# Patient Record
Sex: Male | Born: 1970 | Race: Black or African American | Hispanic: No | Marital: Married | Smoking: Never smoker
Health system: Southern US, Community
[De-identification: ages and names within clinical notes are randomized; demographics above are authoritative.]

## PROBLEM LIST (undated history)

## (undated) DIAGNOSIS — Z87442 Personal history of urinary calculi: Secondary | ICD-10-CM

## (undated) DIAGNOSIS — H43821 Vitreomacular adhesion, right eye: Secondary | ICD-10-CM

## (undated) DIAGNOSIS — E785 Hyperlipidemia, unspecified: Secondary | ICD-10-CM

## (undated) DIAGNOSIS — Z973 Presence of spectacles and contact lenses: Secondary | ICD-10-CM

## (undated) DIAGNOSIS — J45909 Unspecified asthma, uncomplicated: Secondary | ICD-10-CM

## (undated) DIAGNOSIS — B54 Unspecified malaria: Secondary | ICD-10-CM

## (undated) DIAGNOSIS — B1910 Unspecified viral hepatitis B without hepatic coma: Secondary | ICD-10-CM

## (undated) DIAGNOSIS — Z8619 Personal history of other infectious and parasitic diseases: Secondary | ICD-10-CM

## (undated) DIAGNOSIS — I10 Essential (primary) hypertension: Secondary | ICD-10-CM

## (undated) DIAGNOSIS — N329 Bladder disorder, unspecified: Secondary | ICD-10-CM

## (undated) DIAGNOSIS — R3915 Urgency of urination: Secondary | ICD-10-CM

## (undated) DIAGNOSIS — R300 Dysuria: Secondary | ICD-10-CM

## (undated) DIAGNOSIS — R351 Nocturia: Secondary | ICD-10-CM

## (undated) DIAGNOSIS — N289 Disorder of kidney and ureter, unspecified: Secondary | ICD-10-CM

## (undated) DIAGNOSIS — C801 Malignant (primary) neoplasm, unspecified: Secondary | ICD-10-CM

## (undated) HISTORY — DX: Essential (primary) hypertension: I10

## (undated) HISTORY — DX: Unspecified asthma, uncomplicated: J45.909

---

## 2000-09-28 ENCOUNTER — Ambulatory Visit (HOSPITAL_COMMUNITY): Admission: RE | Admit: 2000-09-28 | Discharge: 2000-09-28 | Payer: Self-pay | Admitting: Family Medicine

## 2000-09-28 ENCOUNTER — Encounter: Payer: Self-pay | Admitting: Family Medicine

## 2004-03-18 ENCOUNTER — Encounter: Admission: RE | Admit: 2004-03-18 | Discharge: 2004-03-18 | Payer: Self-pay | Admitting: Internal Medicine

## 2005-12-27 ENCOUNTER — Emergency Department (HOSPITAL_COMMUNITY): Admission: EM | Admit: 2005-12-27 | Discharge: 2005-12-27 | Payer: Self-pay | Admitting: Emergency Medicine

## 2010-04-19 ENCOUNTER — Emergency Department (HOSPITAL_COMMUNITY): Admission: EM | Admit: 2010-04-19 | Discharge: 2010-04-19 | Payer: Self-pay | Admitting: Emergency Medicine

## 2010-10-27 LAB — POCT I-STAT, CHEM 8
BUN: 9 mg/dL (ref 6–23)
Chloride: 102 mEq/L (ref 96–112)
Creatinine, Ser: 1.1 mg/dL (ref 0.4–1.5)
Glucose, Bld: 85 mg/dL (ref 70–99)
HCT: 58 % — ABNORMAL HIGH (ref 39.0–52.0)
Potassium: 3.2 mEq/L — ABNORMAL LOW (ref 3.5–5.1)

## 2010-10-27 LAB — URINALYSIS, ROUTINE W REFLEX MICROSCOPIC
Bilirubin Urine: NEGATIVE
Glucose, UA: NEGATIVE mg/dL
Ketones, ur: NEGATIVE mg/dL
Leukocytes, UA: NEGATIVE
Nitrite: NEGATIVE
Protein, ur: NEGATIVE mg/dL
Specific Gravity, Urine: 1.014 (ref 1.005–1.030)
Urobilinogen, UA: 1 mg/dL (ref 0.0–1.0)
pH: 5.5 (ref 5.0–8.0)

## 2010-10-27 LAB — URINE MICROSCOPIC-ADD ON

## 2010-10-27 LAB — URINE CULTURE

## 2013-10-10 ENCOUNTER — Ambulatory Visit (INDEPENDENT_AMBULATORY_CARE_PROVIDER_SITE_OTHER): Payer: BC Managed Care – PPO | Admitting: Internal Medicine

## 2013-10-10 VITALS — BP 128/78 | HR 68 | Temp 98.0°F | Resp 17 | Ht 71.0 in | Wt 209.0 lb

## 2013-10-10 DIAGNOSIS — B80 Enterobiasis: Secondary | ICD-10-CM

## 2013-10-10 DIAGNOSIS — L290 Pruritus ani: Secondary | ICD-10-CM

## 2013-10-10 MED ORDER — ALBENDAZOLE 200 MG PO TABS: ORAL_TABLET | ORAL | Status: DC

## 2013-10-10 NOTE — Patient Instructions (Signed)
Anal Pruritus Anal pruritus is an itching of the anus, which is often due to increased moisture of the skin around the anus. Moisture may be due to sweating or a small amount of remaining stool. The itching and scratching can cause further skin damage.  CAUSES   Poor hygiene.  Excessive moisture from sweating or residual stool in the anal area.  Perfumed soaps and sprays and colored toilet paper.  Chemicals in the foods you eat.  Dietary factors such as caffeine, beer, milk products, chocolate, nuts, citrus fruits, tomatoes, spicy seasonings, jalapeno peppers, and salsa.  Hemorrhoids, infections, and other anal diseases.  Excessive washing.  Overuse of laxatives.  Skin disorders (psoriasis, eczema, or seborrhea). HOME CARE INSTRUCTIONS   Practice good hygiene.  Clean the anal area gently with wet toilet paper, baby wipes, or a wet washcloth after every bowel movement and at bedtime. Avoid using soaps on the anal area. Dry the area thoroughly. Pat the area dry with toilet paper or a towel.  Do not scrub the anal area with anything, even toilet paper.  Try not to scratch the itchy area. Scratching produces more damage, which makes the itching worse.  Take sitz baths in warm water for 15 to 20 minutes, 2 to 3 times a day. Pat the area dry with a soft cloth after each bath.  Zinc oxide ointment or a moisture barrier cream can be applied several times daily to protect the skin.  Only take medicines as directed by your caregiver.  Talk to your caregiver about fiber supplements. These are helpful in normalizing the stool if you have frequent loose stools.  Wear cotton underwear and loose clothing.  Do not use irritants such as bubble baths, scented toilet paper, or genital deodorants. SEEK MEDICAL CARE IF:   Itching does not improve in several days or gets worse.  You have a fever.  There are problems with increased pain, swelling, or redness. MAKE SURE YOU:   Understand  these instructions.  Will watch your condition.  Will get help right away if you are not doing well or get worse. Document Released: 01/30/2011 Document Revised: 10/23/2011 Document Reviewed: 01/30/2011 [facility] Patient Information 2014 [place]. Pinworms Your caregiver has diagnosed you as having pinworms. These are common infections of children and less common in adults. Pinworms are a small white worm less one quarter to a half inch in length. They look like a tiny piece of white thread. A person gets pinworms by swallowing the eggs of the worm. These eggs are obtained from contaminated (infected or tainted) food, clothing, toys, or any object that comes in contact with the body and mouth. The eggs hatch in the small bowel (intestine) and quickly develop into adult worms in the large bowel (colon). The male worm develops in the large intestine for about two to four weeks. It lays eggs around the anus during the night. These eggs then contaminate clothing, fingers, bedding, and anything else they come in contact with. The main symptoms (problems) of pinworms are itching around the anus (pruritus ani) at night. Children may also have occasional abdominal (belly) pain, loss of appetite, problems sleeping, and irritability. If you or your child has continual anal itching at night, that is a good sign to consult your caregiver. Just about everybody at some time in their life has acquired pinworms. Getting them has nothing to do with the cleanliness of your household or your personal hygiene. Complications are uncommon. DIAGNOSIS  Diagnosis can be made by  looking at your child's anus at night when the pinworms are laying eggs or by sticking a piece of scotch tape on the anus in the morning. The eggs will stick to the tape. This can be examined by your caregiver who can make a diagnosis by looking at the tape under a microscope. Sometimes several scotch tape swabs will be necessary.  HOME CARE  INSTRUCTIONS   Your caregiver will give you medications. They should be taken as directed. Eggs are easily passed. The whole family often needs treatment even if no symptoms are present. Several treatments may be necessary. A second treatment is usually needed after two weeks to a month.  Maintain strict hygiene. Washing hands often and keeping the nails short is helpful. Children often scratch themselves at night in their sleep so the eggs get under the nail. This causes reinfection by hand to mouth contamination.  Change bedding and clothing daily. These should be washed in hot water and dried. This kills the eggs and stops the life cycle of the worm.  Pets are not known to carry pinworms.  An ointment may be used at night for anal itching.  See your caregiver if problems continue. Document Released: 07/28/2000 Document Revised: 10/23/2011 Document Reviewed: 07/28/2008 [facility] Patient Information 2014 [place].

## 2013-10-10 NOTE — Progress Notes (Signed)
   Subjective:    Patient ID: James Larsen, male    DOB: [birth year], 43 y.o.   MRN: [id number]  HPI pt here for anal itching for the past x 2 months , pt thinks it may be parasites. Itching wakes him up at night. However not every night. Bowels moving okay, eating and drinking okay. Denies fever, nausea, vomiting, constipation, or diarrhea. PCP [facility]. History of HBP.  Has 3 children 5,2, and 29mos.  He thinks he has pinworms   Review of Systems     Objective:   Physical Exam  Constitutional: He appears well-developed and well-nourished.  HENT:  Head: Normocephalic.  Eyes: EOM are normal.  Pulmonary/Chest: Effort normal.  Genitourinary: Prostate normal, testes normal and penis normal. Rectal exam shows no external hemorrhoid, no fissure, no mass, no tenderness and anal tone normal.  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

## 2013-10-10 NOTE — Progress Notes (Signed)
   Subjective:    Patient ID: James Larsen, male    DOB: 1971/01/16, 43 y.o.   MRN: [id number]  HPI    Review of Systems     Objective:   Physical Exam        Assessment & Plan:

## 2014-04-25 ENCOUNTER — Observation Stay (HOSPITAL_COMMUNITY): Payer: BC Managed Care – PPO

## 2014-04-25 ENCOUNTER — Observation Stay (HOSPITAL_COMMUNITY)
Admission: EM | Admit: 2014-04-25 | Discharge: 2014-04-25 | Disposition: A | Discharge status: Home / Self Care | Payer: BC Managed Care – PPO | Attending: Internal Medicine | Admitting: Internal Medicine

## 2014-04-25 ENCOUNTER — Emergency Department (HOSPITAL_COMMUNITY): Payer: BC Managed Care – PPO

## 2014-04-25 ENCOUNTER — Encounter (HOSPITAL_COMMUNITY): Payer: Self-pay | Admitting: Emergency Medicine

## 2014-04-25 DIAGNOSIS — Z79899 Other long term (current) drug therapy: Secondary | ICD-10-CM | POA: Diagnosis not present

## 2014-04-25 DIAGNOSIS — J4520 Mild intermittent asthma, uncomplicated: Secondary | ICD-10-CM

## 2014-04-25 DIAGNOSIS — R079 Chest pain, unspecified: Secondary | ICD-10-CM | POA: Diagnosis present

## 2014-04-25 DIAGNOSIS — J45909 Unspecified asthma, uncomplicated: Secondary | ICD-10-CM | POA: Diagnosis not present

## 2014-04-25 DIAGNOSIS — I10 Essential (primary) hypertension: Secondary | ICD-10-CM | POA: Diagnosis not present

## 2014-04-25 LAB — BASIC METABOLIC PANEL
ANION GAP: 11 (ref 5–15)
BUN: 10 mg/dL (ref 6–23)
CALCIUM: 8.8 mg/dL (ref 8.4–10.5)
CHLORIDE: 104 meq/L (ref 96–112)
CO2: 25 meq/L (ref 19–32)
Creatinine, Ser: 0.98 mg/dL (ref 0.50–1.35)
GFR calc non Af Amer: >90 mL/min (ref >90)
Glucose, Bld: 91 mg/dL (ref 70–99)
Potassium: 3.7 mEq/L (ref 3.7–5.3)
SODIUM: 140 meq/L (ref 137–147)

## 2014-04-25 LAB — CBC
HCT: 42.7 % (ref 39.0–52.0)
Hemoglobin: 15.4 g/dL (ref 13.0–17.0)
MCH: 30.1 pg (ref 26.0–34.0)
MCHC: 36.1 g/dL — ABNORMAL HIGH (ref 30.0–36.0)
MCV: 83.4 fL (ref 78.0–100.0)
PLATELETS: 180 10*3/uL (ref 150–400)
RBC: 5.12 MIL/uL (ref 4.22–5.81)
RDW: 12.3 % (ref 11.5–15.5)
WBC: 2.7 10*3/uL — AB (ref 4.0–10.5)

## 2014-04-25 LAB — I-STAT TROPONIN, ED: TROPONIN I, POC: 0 ng/mL (ref 0.00–0.08)

## 2014-04-25 LAB — TROPONIN I

## 2014-04-25 MED ORDER — ASPIRIN EC 325 MG PO TBEC: 325.0000 mg | DELAYED_RELEASE_TABLET | Freq: Every day | ORAL | Status: DC

## 2014-04-25 MED ORDER — ACETAMINOPHEN 325 MG PO TABS: 650.0000 mg | ORAL_TABLET | Freq: Four times a day (QID) | ORAL | Status: DC | PRN

## 2014-04-25 MED ORDER — HYDROCHLOROTHIAZIDE 12.5 MG PO CAPS
12.5000 mg | ORAL_CAPSULE | Freq: Every day | ORAL | Status: DC
  Administered 2014-04-25: 12.5 mg via ORAL
  Filled 2014-04-25: qty 1

## 2014-04-25 MED ORDER — LOSARTAN POTASSIUM 50 MG PO TABS
50.0000 mg | ORAL_TABLET | Freq: Every day | ORAL | Status: DC
  Administered 2014-04-25: 50 mg via ORAL
  Filled 2014-04-25: qty 1

## 2014-04-25 MED ORDER — ALUM & MAG HYDROXIDE-SIMETH 200-200-20 MG/5ML PO SUSP: 30.0000 mL | Freq: Four times a day (QID) | ORAL | Status: DC | PRN

## 2014-04-25 MED ORDER — HYDROMORPHONE HCL PF 1 MG/ML IJ SOLN: 0.5000 mg | INTRAMUSCULAR | Status: DC | PRN

## 2014-04-25 MED ORDER — AMITRIPTYLINE HCL 25 MG PO TABS
25.0000 mg | ORAL_TABLET | Freq: Every day | ORAL | Status: DC
  Filled 2014-04-25: qty 1

## 2014-04-25 MED ORDER — ENOXAPARIN SODIUM 40 MG/0.4ML ~~LOC~~ SOLN
40.0000 mg | SUBCUTANEOUS | Status: DC
  Administered 2014-04-25: 40 mg via SUBCUTANEOUS
  Filled 2014-04-25: qty 0.4

## 2014-04-25 MED ORDER — NITROGLYCERIN 0.4 MG SL SUBL: 0.4000 mg | SUBLINGUAL_TABLET | SUBLINGUAL | Status: DC | PRN

## 2014-04-25 MED ORDER — SODIUM CHLORIDE 0.9 % IJ SOLN: 3.0000 mL | Freq: Two times a day (BID) | INTRAMUSCULAR | Status: DC

## 2014-04-25 MED ORDER — OXYCODONE HCL 5 MG PO TABS: 5.0000 mg | ORAL_TABLET | ORAL | Status: DC | PRN

## 2014-04-25 MED ORDER — ACETAMINOPHEN 650 MG RE SUPP: 650.0000 mg | Freq: Four times a day (QID) | RECTAL | Status: DC | PRN

## 2014-04-25 MED ORDER — ONDANSETRON HCL 4 MG/2ML IJ SOLN: 4.0000 mg | Freq: Four times a day (QID) | INTRAMUSCULAR | Status: DC | PRN

## 2014-04-25 MED ORDER — SODIUM CHLORIDE 0.9 % IV SOLN: 250.0000 mL | INTRAVENOUS | Status: DC | PRN

## 2014-04-25 MED ORDER — NITROGLYCERIN 2 % TD OINT
0.5000 [in_us] | TOPICAL_OINTMENT | Freq: Four times a day (QID) | TRANSDERMAL | Status: DC
  Filled 2014-04-25: qty 1

## 2014-04-25 MED ORDER — TECHNETIUM TC 99M SESTAMIBI - CARDIOLITE
30.0000 | Freq: Once | INTRAVENOUS | Status: AC | PRN
  Administered 2014-04-25: 30 via INTRAVENOUS

## 2014-04-25 MED ORDER — LOSARTAN POTASSIUM-HCTZ 50-12.5 MG PO TABS: 1.0000 | ORAL_TABLET | Freq: Every day | ORAL | Status: DC

## 2014-04-25 MED ORDER — REGADENOSON 0.4 MG/5ML IV SOLN: 0.4000 mg | Freq: Once | INTRAVENOUS | Status: DC

## 2014-04-25 MED ORDER — TECHNETIUM TC 99M SESTAMIBI - CARDIOLITE
10.0000 | Freq: Once | INTRAVENOUS | Status: AC | PRN
  Administered 2014-04-25: 10 via INTRAVENOUS

## 2014-04-25 MED ORDER — REGADENOSON 0.4 MG/5ML IV SOLN
INTRAVENOUS | Status: DC
Start: 2014-04-25 — End: 2014-04-25
  Filled 2014-04-25: qty 5

## 2014-04-25 MED ORDER — ONDANSETRON HCL 4 MG PO TABS: 4.0000 mg | ORAL_TABLET | Freq: Four times a day (QID) | ORAL | Status: DC | PRN

## 2014-04-25 MED ORDER — SODIUM CHLORIDE 0.9 % IJ SOLN: 3.0000 mL | INTRAMUSCULAR | Status: DC | PRN

## 2014-04-25 NOTE — Discharge Summary (Signed)
Physician Discharge Summary  James Larsen WYO:[id number] DOB: [birth year] DOA: 04/25/2014  PCP: Philis Fendt, MD  Admit date: 04/25/2014 Discharge date: 04/25/2014  Time spent: >45 minutes   Discharge Condition: stable Diet recommendation: heart healthy  Discharge Diagnoses:  Principal Problem:   Chest pain, unspecified Active Problems:   Hypertension   Asthma   History of present illness:  James Larsen is a 43 y.o. male with a history of Asthma and HTN who presents to the ED with complaints of sudden onset of 8/10 substernal chest pain described as dull and heaviness. He reports having associated SOB but no nausea or vomiting or diaphoresis. The pain did not resolve until he had taken 2 SL NTG and an Aspirin. The pain occurred at 3 AM. He reports he had a previous episode the night before which last 5-6 minutes. He has a family history of CAD in his mother, and he denies any smoking history.  He had 3 sets of Tropnonins performed that were negative and a cardiology consult was requested. A Myoview stress test was performed today which negative for reversible defects. EF was 59%. He is being discharged home in stable condition. No changes in medications have been made.   Discharge Exam: Filed Weights   04/25/14 0314 04/25/14 0832  Weight: 94.348 kg (208 lb) 96 kg (211 lb 10.3 oz)   Filed Vitals:   04/25/14 1600  BP: 136/91  Pulse: 77  Temp: 97.9 F (36.6 C)  Resp: 20    General: AAO x 3, no distress Cardiovascular: RRR, no murmurs  Respiratory: clear to auscultation bilaterally GI: soft, non-tender, non-distended, bowel sound positive  Discharge Instructions You were cared for by a hospitalist during your hospital stay. If you have any questions about your discharge medications or the care you received while you were in the hospital after you are discharged, you can call the unit and asked to speak with the hospitalist on call if the hospitalist that took care of you is  not available. Once you are discharged, your primary care physician will handle any further medical issues. Please note that NO REFILLS for any discharge medications will be authorized once you are discharged, as it is imperative that you return to your primary care physician (or establish a relationship with a primary care physician if you do not have one) for your aftercare needs so that they can reassess your need for medications and monitor your lab values.      Discharge Instructions   Diet - low sodium heart healthy    Complete by:  As directed      Increase activity slowly    Complete by:  As directed             Medication List         amitriptyline 25 MG tablet  Commonly known as:  ELAVIL  Take 25 mg by mouth at bedtime.     aspirin EC 325 MG tablet  Take 325 mg by mouth daily.     losartan-hydrochlorothiazide 50-12.5 MG per tablet  Commonly known as:  HYZAAR  Take 1 tablet by mouth daily.     nitroGLYCERIN 0.4 MG SL tablet  Commonly known as:  NITROSTAT  Place 0.4 mg under the tongue every 5 (five) minutes as needed for chest pain.       No Known Allergies    The results of significant diagnostics from this hospitalization (including imaging, microbiology, ancillary and laboratory) are listed below for reference.  Significant Diagnostic Studies: Dg Chest 2 View  04/25/2014   CLINICAL DATA:  Midsternal chest pain.  History of smoking.  EXAM: CHEST  2 VIEW  COMPARISON:  None.  FINDINGS: The lungs are well-aerated and clear. There is no evidence of focal opacification, pleural effusion or pneumothorax.  The heart is normal in size; the mediastinal contour is within normal limits. No acute osseous abnormalities are seen.  IMPRESSION: No acute cardiopulmonary process seen.   Electronically Signed   By: Garald Balding M.D.   On: 04/25/2014 05:41   Nm Myocar Multi W/planar W/wall Motion / Ef  04/25/2014   CLINICAL DATA:  chest pain  EXAM: MYOCARDIAL IMAGING WITH SPECT  (REST AND EXERCISE)  GATED LEFT VENTRICULAR WALL MOTION STUDY  LEFT VENTRICULAR EJECTION FRACTION  TECHNIQUE: Standard myocardial SPECT imaging was performed after resting intravenous injection of 10 mCi Tc-58m sestamibi. Subsequently, exercise tolerance test was performed by the patient under the supervision of the Cardiology staff. At peak-stress, 30 mCi Tc-3m sestamibi was injected intravenously and standard myocardial SPECT imaging was performed. Quantitative gated imaging was also performed to evaluate left ventricular wall motion, and estimate left ventricular ejection fraction.  COMPARISON:  None.  FINDINGS: Perfusion: Moderate region of mildly decreased activity in the inferior wall of the left ventricular myocardium. No significant change on rest images to suggest reversibility.  Wall Motion: Normal left ventricular wall motion. No left ventricular dilation.  Left Ventricular Ejection Fraction: 59 %  End diastolic volume 36 ml  End systolic volume 87 ml  IMPRESSION: 1. No reversible ischemia.  Mild fixed inferior wall defect.  2. Normal left ventricular wall motion.  3. Left ventricular ejection fraction 59%  4. Low-risk stress test findings*.  *2012 Appropriate Use Criteria for Coronary Revascularization Focused Update: J Am Coll Cardiol. 7262;03(5):597-416. http://content.airportbarriers.com.aspx?articleid=[email or web address]   Electronically Signed   By: Arne Cleveland M.D.   On: 04/25/2014 15:54    Microbiology: No results found for this or any previous visit (from the past 240 hour(s)).   Labs: Basic Metabolic Panel:  Recent Labs Lab 04/25/14 0331  NA 140  K 3.7  CL 104  CO2 25  GLUCOSE 91  BUN 10  CREATININE 0.98  CALCIUM 8.8   Liver Function Tests: No results found for this basename: AST, ALT, ALKPHOS, BILITOT, PROT, ALBUMIN,  in the last 168 hours No results found for this basename: LIPASE, AMYLASE,  in the last 168 hours No results found for this basename: AMMONIA,  in the last 168  hours CBC:  Recent Labs Lab 04/25/14 0331  WBC 2.7*  HGB 15.4  HCT 42.7  MCV 83.4  PLT 180   Cardiac Enzymes:  Recent Labs Lab 04/25/14 0622 04/25/14 1210  TROPONINI <0.30 <0.30   BNP: BNP (last 3 results) No results found for this basename: PROBNP,  in the last 8760 hours CBG: No results found for this basename: GLUCAP,  in the last 168 hours     Signed:  Debbe Odea, MD [facility] 04/25/2014, 5:18 PM

## 2014-04-25 NOTE — Progress Notes (Signed)
Exercise CL performed. Target HR achieved.

## 2014-04-25 NOTE — Consult Note (Signed)
Admit date: 04/25/2014 Referring Physician  Dr. Wynelle Cleveland Primary Physician  Dr. Bryon Lions Primary Cardiologist  NON Reason for Consultation  Chest pain  HPI: James Larsen is a 43 y.o. male with a history of Asthma and HTN who presents to the ED with complaints of sudden onset of 8/10 substernal chest pain described as dull and heaviness. He reports having associated SOB but no nausea or vomiting or diaphoresis. The pain did not resolve until he had taken 2 SL NTG and an Aspirin. The pain occurred at 3 AM. He reports he had a previous episode the night before which last 5-6 minutes. He has a family history of CAD in his mother, and he denies any smoking history. Cardiac enzymes are negative x 2 and EKG NSR with early repolarization.  Cardiology is now asked to consult for workup of CP.     PMH:   Past Medical History  Diagnosis Date  . Asthma   . Hypertension      PSH:  History reviewed. No pertinent past surgical history.  Allergies:  Review of patient's allergies indicates no known allergies. Prior to Admit Meds:   Prescriptions prior to admission  Medication Sig Dispense Refill  . amitriptyline (ELAVIL) 25 MG tablet Take 25 mg by mouth at bedtime.      Marland Kitchen aspirin EC 325 MG tablet Take 325 mg by mouth daily.      Marland Kitchen losartan-hydrochlorothiazide (HYZAAR) 50-12.5 MG per tablet Take 1 tablet by mouth daily.      . nitroGLYCERIN (NITROSTAT) 0.4 MG SL tablet Place 0.4 mg under the tongue every 5 (five) minutes as needed for chest pain.       Fam HX:    Family History  Problem Relation Age of Onset  . Hypertension Mother    Social HX:    History   Social History  . Marital Status: Married    Spouse Name: N/A    Number of Children: N/A  . Years of Education: N/A   Occupational History  . Not on file.   Social History Main Topics  . Smoking status: Never Smoker   . Smokeless tobacco: Not on file  . Alcohol Use: No  . Drug Use: No  . Sexual Activity: No   Other Topics Concern    . Not on file   Social History Narrative  . No narrative on file     ROS:  All 11 ROS were addressed and are negative except what is stated in the HPI  Physical Exam: Blood pressure 130/89, pulse 60, temperature 98.4 F (36.9 C), temperature source Oral, resp. rate 18, height 5\' 11"  (1.803 m), weight 211 lb 10.3 oz (96 kg), SpO2 100.00%.    General: Well developed, well nourished, in no acute distress Head: Eyes PERRLA, No xanthomas.   Normal cephalic and atramatic  Lungs:   Clear bilaterally to auscultation and percussion. Heart:   HRRR S1 S2 Pulses are 2+ & equal.            No carotid bruit. No JVD.  No abdominal bruits. No femoral bruits. Abdomen: Bowel sounds are positive, abdomen soft and non-tender without masses  Extremities:   No clubbing, cyanosis or edema.  DP +1 Neuro: Alert and oriented X 3. Psych:  Good affect, responds appropriately    Labs:   Lab Results  Component Value Date   WBC 2.7* 04/25/2014   HGB 15.4 04/25/2014   HCT 42.7 04/25/2014   MCV 83.4 04/25/2014   PLT 180  04/25/2014    Recent Labs Lab 04/25/14 0331  NA 140  K 3.7  CL 104  CO2 25  BUN 10  CREATININE 0.98  CALCIUM 8.8  GLUCOSE 91   No results found for this basename: PTT   No results found for this basename: INR, PROTIME   Lab Results  Component Value Date   TROPONINI <0.30 04/25/2014     No results found for this basename: CHOL   No results found for this basename: HDL   No results found for this basename: LDLCALC   No results found for this basename: TRIG   No results found for this basename: CHOLHDL   No results found for this basename: LDLDIRECT      Radiology:  Dg Chest 2 View  04/25/2014   CLINICAL DATA:  Midsternal chest pain.  History of smoking.  EXAM: CHEST  2 VIEW  COMPARISON:  None.  FINDINGS: The lungs are well-aerated and clear. There is no evidence of focal opacification, pleural effusion or pneumothorax.  The heart is normal in size; the mediastinal  contour is within normal limits. No acute osseous abnormalities are seen.  IMPRESSION: No acute cardiopulmonary process seen.   Electronically Signed   By: Garald Balding M.D.   On: 04/25/2014 05:41    EKG:  NSR with early repolarization  ASSESSMENT:  1.  Chest pain described as a heaviness and dullness but no associated symptoms of SOB, nausea or diaphoresis.  All episodes occurred at rest but second episode he had to take 2SL NTG for relief.  His mother had CHF in her 32's and then died of an MI in her 57's.  He has no history of tobacco and his CRF include male sex, age >66 and HTN.  EKG shows early repolarization. 2.  HTN controlled 3.  Asthma  PLAN:   1.  NPO 2.  Stress myoview today to rule out ischemia 3.  2D echo to assess LVF with family history of CHF  Sueanne Margarita, MD  04/25/2014  11:31 AM

## 2014-04-25 NOTE — ED Notes (Signed)
Attempted to give report 

## 2014-04-25 NOTE — Progress Notes (Signed)
UR completed 

## 2014-04-25 NOTE — H&P (Signed)
[facility] Admission History and Physical       Gregoire Bennis JJO:[id number] DOB: 01-26-1971 DOA: 04/25/2014  Referring physician:  EDP PCP: Philis Fendt, MD  Specialists:   Chief Complaint:  Chest Pain  HPI: Red Mandt is a 43 y.o. male with a history of Asthma and HTN who presents to the ED with complaints of sudden onset of  8/10 substernal chest pain described as dull and heaviness.  He reports having associated SOB but no nausea or vomiting or diaphoresis.  The pain did not resolve until he had taken 2 SL NTG and an Aspirin.  The pain occurred at  3 AM.   He reports he had a previous episode the night before which last 5-6 minutes.    He has a family history of CAD in his mother, and he denies any smoking history.      Review of Systems:  Constitutional: No Weight Loss, No Weight Gain, Night Sweats, Fevers, Chills, Dizziness, Fatigue, or Generalized Weakness HEENT: No Headaches, Difficulty Swallowing,Tooth/Dental Problems,Sore Throat,  No Sneezing, Rhinitis, Ear Ache, Nasal Congestion, or Post Nasal Drip,  Cardio-vascular:  +Chest pain, Orthopnea, PND, Edema in Lower Extremities, Anasarca, Dizziness, Palpitations  Resp: +Dyspnea, No DOE, No Cough, No Hemoptysis, No Wheezing.    GI: No Heartburn, Indigestion, Abdominal Pain, Nausea, Vomiting, Diarrhea, Hematemesis, Hematochezia, Melena, Change in Bowel Habits,  Loss of Appetite  GU: No Dysuria, Change in Color of Urine, No Urgency or Frequency, No Flank pain.  Musculoskeletal: No Joint Pain or Swelling, No Decreased Range of Motion, No Back Pain.  Neurologic: No Syncope, No Seizures, Muscle Weakness, Paresthesia, Vision Disturbance or Loss, No Diplopia, No Vertigo, No Difficulty Walking,  Skin: No Rash or Lesions. Psych: No Change in Mood or Affect, No Depression or Anxiety, No Memory loss, No Confusion, or Hallucinations   Past Medical History  Diagnosis Date  . Asthma   . Hypertension       History  reviewed. No pertinent past surgical history.     Prior to Admission medications   Medication Sig Start Date End Date Taking? Authorizing Provider  amitriptyline (ELAVIL) 25 MG tablet Take 25 mg by mouth at bedtime.   Yes Historical Provider, MD  aspirin EC 325 MG tablet Take 325 mg by mouth daily.   Yes Historical Provider, MD  losartan-hydrochlorothiazide (HYZAAR) 50-12.5 MG per tablet Take 1 tablet by mouth daily.   Yes Historical Provider, MD  nitroGLYCERIN (NITROSTAT) 0.4 MG SL tablet Place 0.4 mg under the tongue every 5 (five) minutes as needed for chest pain.   Yes Historical Provider, MD      No Known Allergies   Social History:  reports that he has never smoked. He does not have any smokeless tobacco history on file. He reports that he does not drink alcohol or use illicit drugs.     Family History  Problem Relation Age of Onset  . Hypertension Mother        Physical Exam:  GEN:  Pleasant Well Nourished and Well Developed  43 y.o. African male examined  and in no acute distress; cooperative with exam Filed Vitals:   04/25/14 0445 04/25/14 0515 04/25/14 0545 04/25/14 0615  BP: 129/81 127/96 142/102 127/92  Pulse: 56 61 67 58  Temp:      TempSrc:      Resp: 19     Height:      Weight:      SpO2: 98% 100% 98% 93%   Blood pressure 127/92,  pulse 58, temperature 97.9 F (36.6 C), temperature source Oral, resp. rate 19, height 5\' 11"  (1.803 m), weight 94.348 kg (208 lb), SpO2 93.00%. PSYCH: He is alert and oriented x4; does not appear anxious does not appear depressed; affect is normal HEENT: Normocephalic and Atraumatic, Mucous membranes pink; PERRLA; EOM intact; Fundi:  Benign;  No scleral icterus, Nares: Patent, Oropharynx: Clear, Fair Dentition,    Neck:  FROM, No Cervical Lymphadenopathy nor Thyromegaly or Carotid Bruit; No JVD; Breasts:: Not examined CHEST WALL: No tenderness CHEST: Normal respiration, clear to auscultation bilaterally HEART: Regular rate and  rhythm; no murmurs rubs or gallops BACK: No kyphosis or scoliosis; No CVA tenderness ABDOMEN: Positive Bowel Sounds, Soft Non-Tender; No Masses, No Organomegaly Rectal Exam: Not done EXTREMITIES: No Cyanosis, Clubbing, or Edema; No Ulcerations. Genitalia: not examined PULSES: 2+ and symmetric SKIN: Normal hydration no rash or ulceration CNS: Alert and oriented x 4, No Focal Deficits Vascular: pulses palpable throughout    Labs on Admission:  Basic Metabolic Panel:  Recent Labs Lab 04/25/14 0331  NA 140  K 3.7  CL 104  CO2 25  GLUCOSE 91  BUN 10  CREATININE 0.98  CALCIUM 8.8   Liver Function Tests: No results found for this basename: AST, ALT, ALKPHOS, BILITOT, PROT, ALBUMIN,  in the last 168 hours No results found for this basename: LIPASE, AMYLASE,  in the last 168 hours No results found for this basename: AMMONIA,  in the last 168 hours CBC:  Recent Labs Lab 04/25/14 0331  WBC 2.7*  HGB 15.4  HCT 42.7  MCV 83.4  PLT 180   Cardiac Enzymes: No results found for this basename: CKTOTAL, CKMB, CKMBINDEX, TROPONINI,  in the last 168 hours  BNP (last 3 results) No results found for this basename: PROBNP,  in the last 8760 hours CBG: No results found for this basename: GLUCAP,  in the last 168 hours  Radiological Exams on Admission: Dg Chest 2 View  04/25/2014   CLINICAL DATA:  Midsternal chest pain.  History of smoking.  EXAM: CHEST  2 VIEW  COMPARISON:  None.  FINDINGS: The lungs are well-aerated and clear. There is no evidence of focal opacification, pleural effusion or pneumothorax.  The heart is normal in size; the mediastinal contour is within normal limits. No acute osseous abnormalities are seen.  IMPRESSION: No acute cardiopulmonary process seen.   Electronically Signed   By: Garald Balding M.D.   On: 04/25/2014 05:41     EKG: Independently reviewed. Normal Sinus Rhythm without Acute STR Changes   Assessment/Plan:   43 y.o. male with   Active  Problems:   1.    Chest pain, unspecified   Telemetry monitoring   Cycle Troponins   Nitropaste, O2 , ASA Rx   Stress Test in AM if Negative Workup    2.    Hypertension   Continue Losartan ?HCTZ daily   Monitor BPs       3.    Asthma-   stable     4.    DVT Prophylaxis   Lovenox       Code Status:   FULL CODE Family Communication:   No Family Present  Disposition Plan:    Inpatient  Time spent:  28 mInutes  Theressa Millard [facility] Pager [phone]   If [place] Please Contact the Day Rounding Team MD for [facility]  If 7PM-7AM, Please Contact night-coverage  [email or web address] Password [facility] 04/25/2014, 6:34 AM

## 2014-04-25 NOTE — ED Notes (Signed)
Patient arrives with complaint of central chest pain which he states occurred last night and returned tonight around 0300. Describes as pressure and denies all other anginal equivalents. States that tonight he took 2 NTG SL 0.4 tabs tonight to see if it helped and the pain was relieved. Denies history of cardiac issues, states he had some nitro laying around, so he tried a couple. Additionally took 325 ASA prior to arrival. Patient is an Therapist, sports. Currently states that he feels no pain.

## 2014-04-25 NOTE — ED Provider Notes (Signed)
CSN: [id number]     Arrival date & time 04/25/14  0307 History   First MD Initiated Contact with Patient 04/25/14 [phone]     Chief Complaint  Patient presents with  . Chest Pain     (Consider location/radiation/quality/duration/timing/severity/associated sxs/prior Treatment) Patient is a 43 y.o. male presenting with chest pain. The history is provided by the patient.  Chest Pain He had an episode of chest pain tonight which occurred while he was sitting. He describes a pressure feeling across the anterior chest without radiation. He rated the pressure at 8/10. There is no associated dyspnea, nausea, diaphoresis. He had a similar episode last night which lasted about 5 minutes before resolving. This one did not resolve so he treated himself with a dose of aspirin 325 mg and nitroglycerin sublingually. He had partial relief with one nitroglycerin and completely the the second nitroglycerin. He does have cardiac risk factors of hypertension and positive family history of premature coronary atherosclerosis.  Past Medical History  Diagnosis Date  . Asthma   . Hypertension    History reviewed. No pertinent past surgical history. Family History  Problem Relation Age of Onset  . Hypertension Mother    History  Substance Use Topics  . Smoking status: Never Smoker   . Smokeless tobacco: Not on file  . Alcohol Use: No    Review of Systems  Cardiovascular: Positive for chest pain.  All other systems reviewed and are negative.     Allergies  Review of patient's allergies indicates no known allergies.  Home Medications   Prior to Admission medications   Medication Sig Start Date End Date Taking? Authorizing Provider  albendazole (ALBENZA) 200 MG tablet Take 2 tabs po now,then 2 tabs po in 2 weeks po 10/10/13   Orma Flaming, MD  amitriptyline (ELAVIL) 25 MG tablet Take 25 mg by mouth at bedtime.    Historical Provider, MD  losartan-hydrochlorothiazide (HYZAAR) 50-12.5 MG per tablet Take  1 tablet by mouth daily.    Historical Provider, MD   BP 150/102  Pulse 71  Temp(Src) 97.9 F (36.6 C) (Oral)  Resp 16  Ht 5\' 11"  (1.803 m)  Wt 208 lb (94.348 kg)  BMI 29.02 kg/m2  SpO2 97% Physical Exam  Nursing note and vitals reviewed.  43 year old male, resting comfortably and in no acute distress. Vital signs are significant for hypertension. Oxygen saturation is 97%, which is normal. Head is normocephalic and atraumatic. PERRLA, EOMI. Oropharynx is clear. Neck is nontender and supple without adenopathy or JVD. Back is nontender and there is no CVA tenderness. Lungs are clear without rales, wheezes, or rhonchi. Chest is nontender. Heart has regular rate and rhythm without murmur. Abdomen is soft, flat, nontender without masses or hepatosplenomegaly and peristalsis is normoactive. Extremities have no cyanosis or edema, full range of motion is present. Skin is warm and dry without rash. Neurologic: Mental status is normal, cranial nerves are intact, there are no motor or sensory deficits.  ED Course  Procedures (including critical care time) Labs Review Results for orders placed during the hospital encounter of 04/25/14  CBC      Result Value Ref Range   WBC 2.7 (*) 4.0 - 10.5 K/uL   RBC 5.12  4.22 - 5.81 MIL/uL   Hemoglobin 15.4  13.0 - 17.0 g/dL   HCT 42.7  39.0 - 52.0 %   MCV 83.4  78.0 - 100.0 fL   MCH 30.1  26.0 - 34.0 pg   MCHC  36.1 (*) 30.0 - 36.0 g/dL   RDW 12.3  11.5 - 15.5 %   Platelets 180  150 - 400 K/uL  BASIC METABOLIC PANEL      Result Value Ref Range   Sodium 140  137 - 147 mEq/L   Potassium 3.7  3.7 - 5.3 mEq/L   Chloride 104  96 - 112 mEq/L   CO2 25  19 - 32 mEq/L   Glucose, Bld 91  70 - 99 mg/dL   BUN 10  6 - 23 mg/dL   Creatinine, Ser 0.98  0.50 - 1.35 mg/dL   Calcium 8.8  8.4 - 10.5 mg/dL   GFR calc non Af Amer >90  >90 mL/min   GFR calc Af Amer >90  >90 mL/min   Anion gap 11  5 - 15  I-STAT TROPOININ, ED      Result Value Ref Range    Troponin i, poc 0.00  0.00 - 0.08 ng/mL   Comment 3            Imaging Review Dg Chest 2 View  04/25/2014   CLINICAL DATA:  Midsternal chest pain.  History of smoking.  EXAM: CHEST  2 VIEW  COMPARISON:  None.  FINDINGS: The lungs are well-aerated and clear. There is no evidence of focal opacification, pleural effusion or pneumothorax.  The heart is normal in size; the mediastinal contour is within normal limits. No acute osseous abnormalities are seen.  IMPRESSION: No acute cardiopulmonary process seen.   Electronically Signed   By: Garald Balding M.D.   On: 04/25/2014 05:41     EKG Interpretation   Date/Time:  Saturday April 25 2014 03:13:16 EDT Ventricular Rate:  72 PR Interval:  176 QRS Duration: 92 QT Interval:  378 QTC Calculation: 413 R Axis:   34 Text Interpretation:  Normal sinus rhythm Early repolarization No old  tracing to compare Confirmed by [facility]  MD, Joletta Manner ([id number]) on 04/25/2014  3:15:20 AM      MDM   Final diagnoses:  Chest pain, unspecified    Chest pain worrisome for possible cardiac etiology. Initial ECG is normal. Based on his history, I feel he will need to be kept in the ED for serial troponins and consideration for cardiology referral for stress test in the near future. Case is discussed with Dr. Arnoldo Morale of [facility], who agrees to admit the patient under observation    Delora Fuel, MD 31/54/00 [id number]

## 2014-04-28 ENCOUNTER — Encounter: Payer: Self-pay | Admitting: Internal Medicine

## 2014-06-18 ENCOUNTER — Encounter (HOSPITAL_COMMUNITY): Payer: Self-pay | Admitting: *Deleted

## 2014-06-18 ENCOUNTER — Emergency Department (HOSPITAL_COMMUNITY): Payer: BC Managed Care – PPO

## 2014-06-18 ENCOUNTER — Emergency Department (HOSPITAL_COMMUNITY)
Admission: EM | Admit: 2014-06-18 | Discharge: 2014-06-19 | Disposition: A | Discharge status: Home / Self Care | Payer: BC Managed Care – PPO | Attending: Emergency Medicine | Admitting: Emergency Medicine

## 2014-06-18 DIAGNOSIS — R109 Unspecified abdominal pain: Secondary | ICD-10-CM

## 2014-06-18 DIAGNOSIS — Z7982 Long term (current) use of aspirin: Secondary | ICD-10-CM | POA: Diagnosis not present

## 2014-06-18 DIAGNOSIS — N200 Calculus of kidney: Secondary | ICD-10-CM | POA: Diagnosis not present

## 2014-06-18 DIAGNOSIS — J45909 Unspecified asthma, uncomplicated: Secondary | ICD-10-CM | POA: Diagnosis not present

## 2014-06-18 DIAGNOSIS — Z79899 Other long term (current) drug therapy: Secondary | ICD-10-CM | POA: Insufficient documentation

## 2014-06-18 DIAGNOSIS — I10 Essential (primary) hypertension: Secondary | ICD-10-CM | POA: Diagnosis not present

## 2014-06-18 HISTORY — DX: Disorder of kidney and ureter, unspecified: N28.9

## 2014-06-18 LAB — CBC WITH DIFFERENTIAL/PLATELET
Basophils Absolute: 0 K/uL (ref 0.0–0.1)
Basophils Relative: 0 % (ref 0–1)
Eosinophils Absolute: 0.1 K/uL (ref 0.0–0.7)
Eosinophils Relative: 4 % (ref 0–5)
HCT: 42.3 % (ref 39.0–52.0)
Hemoglobin: 15.1 g/dL (ref 13.0–17.0)
Lymphocytes Relative: 50 % — ABNORMAL HIGH (ref 12–46)
Lymphs Abs: 1.7 K/uL (ref 0.7–4.0)
MCH: 30 pg (ref 26.0–34.0)
MCHC: 35.7 g/dL (ref 30.0–36.0)
MCV: 84.1 fL (ref 78.0–100.0)
Monocytes Absolute: 0.3 K/uL (ref 0.1–1.0)
Monocytes Relative: 9 % (ref 3–12)
Neutro Abs: 1.3 K/uL — ABNORMAL LOW (ref 1.7–7.7)
Neutrophils Relative %: 37 % — ABNORMAL LOW (ref 43–77)
Platelets: 182 K/uL (ref 150–400)
RBC: 5.03 MIL/uL (ref 4.22–5.81)
RDW: 12.3 % (ref 11.5–15.5)
WBC: 3.5 K/uL — ABNORMAL LOW (ref 4.0–10.5)

## 2014-06-18 LAB — I-STAT CHEM 8, ED
BUN: 9 mg/dL (ref 6–23)
Calcium, Ion: 1.24 mmol/L — ABNORMAL HIGH (ref 1.12–1.23)
Chloride: 103 meq/L (ref 96–112)
Creatinine, Ser: 1.2 mg/dL (ref 0.50–1.35)
Glucose, Bld: 86 mg/dL (ref 70–99)
HCT: 45 % (ref 39.0–52.0)
Hemoglobin: 15.3 g/dL (ref 13.0–17.0)
Potassium: 3.6 meq/L — ABNORMAL LOW (ref 3.7–5.3)
Sodium: 143 meq/L (ref 137–147)
TCO2: 27 mmol/L (ref 0–100)

## 2014-06-18 MED ORDER — KETOROLAC TROMETHAMINE 30 MG/ML IJ SOLN
30.0000 mg | Freq: Once | INTRAMUSCULAR | Status: AC
  Administered 2014-06-18: 30 mg via INTRAVENOUS
  Filled 2014-06-18: qty 1

## 2014-06-18 MED ORDER — HYDROMORPHONE HCL 1 MG/ML IJ SOLN
1.0000 mg | Freq: Once | INTRAMUSCULAR | Status: AC
  Administered 2014-06-18: 1 mg via INTRAVENOUS
  Filled 2014-06-18: qty 1

## 2014-06-18 MED ORDER — TAMSULOSIN HCL 0.4 MG PO CAPS
0.4000 mg | ORAL_CAPSULE | Freq: Every day | ORAL | Status: DC
  Administered 2014-06-18: 0.4 mg via ORAL
  Filled 2014-06-18: qty 1

## 2014-06-18 MED ORDER — ONDANSETRON HCL 4 MG/2ML IJ SOLN
4.0000 mg | Freq: Once | INTRAMUSCULAR | Status: DC
  Filled 2014-06-18: qty 2

## 2014-06-18 NOTE — ED Notes (Signed)
Pt c/o rt flank pain; pt denies urinary sx at present; pt states that he has a hx of kidney stones and that this feels like his typical kidney stones

## 2014-06-18 NOTE — ED Provider Notes (Signed)
CSN: [id number]     Arrival date & time 06/18/14  2134 History   First MD Initiated Contact with Patient 06/18/14 2157     Chief Complaint  Patient presents with  . Flank Pain     (Consider location/radiation/quality/duration/timing/severity/associated sxs/prior Treatment) HPI James Larsen is a 43 y.o. male who presents to emergency department complaining of right flank pain. Onset of pain is one hour prior to arrival. Pain is sharp. Radiates to the right abdomen. History of the same. Patient states 4 years ago he had a kidney stone. states pain is similar to that. He denies taking any medications prior to arrival. he denies any nausea or vomiting. No fever. No urinary symptoms. No difficulty having a bowel movement. no prior abdominal surgeries.  Past Medical History  Diagnosis Date  . Asthma   . Hypertension   . Renal disorder     kidney stones   History reviewed. No pertinent past surgical history. Family History  Problem Relation Age of Onset  . Hypertension Mother    History  Substance Use Topics  . Smoking status: Never Smoker   . Smokeless tobacco: Not on file  . Alcohol Use: Yes     Comment: occ    Review of Systems  Constitutional: Negative for fever and chills.  Respiratory: Negative for cough, chest tightness and shortness of breath.   Cardiovascular: Negative for chest pain, palpitations and leg swelling.  Gastrointestinal: Positive for abdominal pain. Negative for nausea, vomiting, diarrhea and abdominal distention.  Genitourinary: Positive for flank pain. Negative for dysuria, urgency, frequency, hematuria, difficulty urinating and testicular pain.  Musculoskeletal: Negative for myalgias, arthralgias, neck pain and neck stiffness.  Skin: Negative for rash.  Allergic/Immunologic: Negative for immunocompromised state.  Neurological: Negative for dizziness, weakness, light-headedness, numbness and headaches.      Allergies  Review of patient's allergies  indicates no known allergies.  Home Medications   Prior to Admission medications   Medication Sig Start Date End Date Taking? Authorizing Provider  amitriptyline (ELAVIL) 25 MG tablet Take 25 mg by mouth at bedtime.   Yes Historical Provider, MD  aspirin EC 325 MG tablet Take 325 mg by mouth daily.   Yes Historical Provider, MD  losartan-hydrochlorothiazide (HYZAAR) 50-12.5 MG per tablet Take 1 tablet by mouth daily.   Yes Historical Provider, MD  nitroGLYCERIN (NITROSTAT) 0.4 MG SL tablet Place 0.4 mg under the tongue every 5 (five) minutes as needed for chest pain.    Historical Provider, MD   BP 157/104 mmHg  Pulse 67  Temp(Src) 98 F (36.7 C) (Oral)  Resp 18  Ht 5\' 11"  (1.803 m)  Wt 240 lb (108.863 kg)  BMI 33.49 kg/m2  SpO2 100% Physical Exam  Constitutional: He is oriented to person, place, and time. He appears well-developed and well-nourished. No distress.  HENT:  Head: Normocephalic and atraumatic.  Eyes: Conjunctivae are normal.  Neck: Neck supple.  Cardiovascular: Normal rate, regular rhythm and normal heart sounds.   Pulmonary/Chest: Effort normal. No respiratory distress. He has no wheezes. He has no rales.  Abdominal: Soft. Bowel sounds are normal. He exhibits no distension. There is no tenderness. There is no rebound.  Right CVA tenderness  Musculoskeletal: He exhibits no edema.  Neurological: He is alert and oriented to person, place, and time.  Skin: Skin is warm and dry.  Nursing note and vitals reviewed.   ED Course  Procedures (including critical care time) Labs Review Labs Reviewed  CBC WITH DIFFERENTIAL - Abnormal; Notable  for the following:    WBC 3.5 (*)    Neutrophils Relative % 37 (*)    Neutro Abs 1.3 (*)    Lymphocytes Relative 50 (*)    All other components within normal limits  URINALYSIS, ROUTINE W REFLEX MICROSCOPIC - Abnormal; Notable for the following:    Hgb urine dipstick SMALL (*)    All other components within normal limits   I-STAT CHEM 8, ED - Abnormal; Notable for the following:    Potassium 3.6 (*)    Calcium, Ion 1.24 (*)    All other components within normal limits  URINE MICROSCOPIC-ADD ON    Imaging Review Ct Abdomen Pelvis Wo Contrast  06/18/2014   CLINICAL DATA:  Right flank pain personal history of kidney stones.  EXAM: CT ABDOMEN AND PELVIS WITHOUT CONTRAST  TECHNIQUE: Multidetector CT imaging of the abdomen and pelvis was performed following the standard protocol without IV contrast.  COMPARISON:  04/19/2010  FINDINGS: There is volume loss at both lung bases, left worse than right. This could be simple atelectasis or there could be basilar pneumonia, particularly on the left. No pleural or pericardial fluid.  The liver appears normal without contrast. No calcified gallstones. The spleen is normal. The pancreas is normal. The adrenal glands are normal. The aorta is tortuous but there is no aneurysm. The IVC is normal. No retroperitoneal mass or adenopathy.  The right kidney shows mild to moderate hydroureteronephrosis. There is focal atrophy of the lower pole with a 2.3 cm cyst. Right ureter is dilated all the way to the bladder. No stones seen in the ureter. There is a 2 mm stone dependent and free within the bladder.  The left kidney does not show any lesion. There is mild fullness of the left ureter throughout its course but there is no definite visible stone.a one could question a 1 mm stone about 3 cm from the UVJ, but this is not definite.  Prostate gland and seminal vesicles are unremarkable. There is a small umbilical hernia that appears to contain a small amount of fluid. No significant bony finding. No acute bowel pathology. The appendix is normal.  IMPRESSION: It appears that a 2 mm stone has passed into the bladder, probably from the right kidney as there is more dilatation of the renal collecting system and ureter on the right. There is mild fullness on the left as well, and one could question if there  is a passing 1 mm stone about 3 cm from the UPJ. That is not definite and is probably doubtful.  Atelectasis and/or pneumonia at the lung bases, left more than right.   Electronically Signed   By: Nelson Chimes M.D.   On: 06/18/2014 23:14     EKG Interpretation None      MDM   Final diagnoses:  Flank pain  Right nephrolithiasis   Pt with right flank, hx of kidney stones, feels the same. Will try toradol, UA, labs, CT abd pelvis.    12:48 AM CT showing a most likely passed stone. Pain down to a 1/10 from 10/10. Pending UA.   UA unremarkable. Pt having some pain again. Pt wanting to drive home, unable to give opiods. Pt is agreeable to d/c with prescription for pain meds. Follow up with urology.   Filed Vitals:   06/18/14 2138  BP: 157/104  Pulse: 67  Temp: 98 F (36.7 C)  TempSrc: Oral  Resp: 18  Height: 5\' 11"  (1.803 m)  Weight: 240 lb (108.863  kg)  SpO2: 100%       Renold Genta, PA-C 06/19/14 [place], MD 06/21/14 [phone]

## 2014-06-19 LAB — URINALYSIS, ROUTINE W REFLEX MICROSCOPIC
Bilirubin Urine: NEGATIVE
GLUCOSE, UA: NEGATIVE mg/dL
Ketones, ur: NEGATIVE mg/dL
LEUKOCYTES UA: NEGATIVE
Nitrite: NEGATIVE
PH: 7.5 (ref 5.0–8.0)
Protein, ur: NEGATIVE mg/dL
SPECIFIC GRAVITY, URINE: 1.017 (ref 1.005–1.030)
Urobilinogen, UA: 0.2 mg/dL (ref 0.0–1.0)

## 2014-06-19 LAB — URINE MICROSCOPIC-ADD ON

## 2014-06-19 MED ORDER — IBUPROFEN 800 MG PO TABS: 800.0000 mg | ORAL_TABLET | Freq: Three times a day (TID) | ORAL | Status: DC

## 2014-06-19 MED ORDER — OXYCODONE-ACETAMINOPHEN 5-325 MG PO TABS: 1.0000 | ORAL_TABLET | ORAL | Status: DC | PRN

## 2014-06-19 NOTE — Discharge Instructions (Signed)
Take ibuprofen as prescribed as needed for pain. Take percocet for severe pain. Follow up with urology as referred.   Kidney Stones Kidney stones (urolithiasis) are deposits that form inside your kidneys. The intense pain is caused by the stone moving through the urinary tract. When the stone moves, the ureter goes into spasm around the stone. The stone is usually passed in the urine.  CAUSES   A disorder that makes certain neck glands produce too much parathyroid hormone (primary hyperparathyroidism).  A buildup of uric acid crystals, similar to gout in your joints.  Narrowing (stricture) of the ureter.  A kidney obstruction present at birth (congenital obstruction).  Previous surgery on the kidney or ureters.  Numerous kidney infections. SYMPTOMS   Feeling sick to your stomach (nauseous).  Throwing up (vomiting).  Blood in the urine (hematuria).  Pain that usually spreads (radiates) to the groin.  Frequency or urgency of urination. DIAGNOSIS   Taking a history and physical exam.  Blood or urine tests.  CT scan.  Occasionally, an examination of the inside of the urinary bladder (cystoscopy) is performed. TREATMENT   Observation.  Increasing your fluid intake.  Extracorporeal shock wave lithotripsy--This is a noninvasive procedure that uses shock waves to break up kidney stones.  Surgery may be needed if you have severe pain or persistent obstruction. There are various surgical procedures. Most of the procedures are performed with the use of small instruments. Only small incisions are needed to accommodate these instruments, so recovery time is minimized. The size, location, and chemical composition are all important variables that will determine the proper choice of action for you. Talk to your health care provider to better understand your situation so that you will minimize the risk of injury to yourself and your kidney.  HOME CARE INSTRUCTIONS   Drink enough water  and fluids to keep your urine clear or pale yellow. This will help you to pass the stone or stone fragments.  Strain all urine through the provided strainer. Keep all particulate matter and stones for your health care provider to see. The stone causing the pain may be as small as a grain of salt. It is very important to use the strainer each and every time you pass your urine. The collection of your stone will allow your health care provider to analyze it and verify that a stone has actually passed. The stone analysis will often identify what you can do to reduce the incidence of recurrences.  Only take over-the-counter or prescription medicines for pain, discomfort, or fever as directed by your health care provider.  Make a follow-up appointment with your health care provider as directed.  Get follow-up X-rays if required. The absence of pain does not always mean that the stone has passed. It may have only stopped moving. If the urine remains completely obstructed, it can cause loss of kidney function or even complete destruction of the kidney. It is your responsibility to make sure X-rays and follow-ups are completed. Ultrasounds of the kidney can show blockages and the status of the kidney. Ultrasounds are not associated with any radiation and can be performed easily in a matter of minutes. SEEK MEDICAL CARE IF:  You experience pain that is progressive and unresponsive to any pain medicine you have been prescribed. SEEK IMMEDIATE MEDICAL CARE IF:   Pain cannot be controlled with the prescribed medicine.  You have a fever or shaking chills.  The severity or intensity of pain increases over 18 hours and is  not relieved by pain medicine.  You develop a new onset of abdominal pain.  You feel faint or pass out.  You are unable to urinate. MAKE SURE YOU:   Understand these instructions.  Will watch your condition.  Will get help right away if you are not doing well or get worse. Document  Released: 07/31/2005 Document Revised: 04/02/2013 Document Reviewed: 01/01/2013 [facility] Patient Information 2015 [place], [place]. This information is not intended to replace advice given to you by your health care provider. Make sure you discuss any questions you have with your health care provider.

## 2014-08-14 DIAGNOSIS — Z8613 Personal history of malaria: Secondary | ICD-10-CM

## 2014-08-14 HISTORY — DX: Personal history of malaria: Z86.13

## 2014-11-29 ENCOUNTER — Observation Stay (HOSPITAL_COMMUNITY)
Admission: EM | Admit: 2014-11-29 | Discharge: 2014-11-30 | Disposition: A | Discharge status: Home / Self Care | Payer: BLUE CROSS/BLUE SHIELD | Attending: Internal Medicine | Admitting: Internal Medicine

## 2014-11-29 ENCOUNTER — Ambulatory Visit (INDEPENDENT_AMBULATORY_CARE_PROVIDER_SITE_OTHER): Payer: BLUE CROSS/BLUE SHIELD | Admitting: Internal Medicine

## 2014-11-29 ENCOUNTER — Emergency Department (HOSPITAL_COMMUNITY): Payer: BLUE CROSS/BLUE SHIELD

## 2014-11-29 ENCOUNTER — Other Ambulatory Visit (HOSPITAL_COMMUNITY): Payer: Self-pay

## 2014-11-29 ENCOUNTER — Encounter (HOSPITAL_COMMUNITY): Payer: Self-pay | Admitting: Emergency Medicine

## 2014-11-29 VITALS — BP 130/70 | HR 71 | Temp 98.2°F | Resp 18 | Ht 70.5 in | Wt 199.8 lb

## 2014-11-29 DIAGNOSIS — B538 Other malaria, not elsewhere classified: Secondary | ICD-10-CM | POA: Insufficient documentation

## 2014-11-29 DIAGNOSIS — Z86718 Personal history of other venous thrombosis and embolism: Secondary | ICD-10-CM

## 2014-11-29 DIAGNOSIS — D72819 Decreased white blood cell count, unspecified: Secondary | ICD-10-CM | POA: Insufficient documentation

## 2014-11-29 DIAGNOSIS — Z7982 Long term (current) use of aspirin: Secondary | ICD-10-CM | POA: Diagnosis not present

## 2014-11-29 DIAGNOSIS — B54 Unspecified malaria: Secondary | ICD-10-CM

## 2014-11-29 DIAGNOSIS — M791 Myalgia: Secondary | ICD-10-CM

## 2014-11-29 DIAGNOSIS — R079 Chest pain, unspecified: Secondary | ICD-10-CM

## 2014-11-29 DIAGNOSIS — I10 Essential (primary) hypertension: Secondary | ICD-10-CM | POA: Insufficient documentation

## 2014-11-29 DIAGNOSIS — M609 Myositis, unspecified: Secondary | ICD-10-CM | POA: Diagnosis not present

## 2014-11-29 DIAGNOSIS — IMO0001 Reserved for inherently not codable concepts without codable children: Secondary | ICD-10-CM

## 2014-11-29 DIAGNOSIS — J45909 Unspecified asthma, uncomplicated: Secondary | ICD-10-CM | POA: Insufficient documentation

## 2014-11-29 DIAGNOSIS — I2699 Other pulmonary embolism without acute cor pulmonale: Secondary | ICD-10-CM | POA: Diagnosis not present

## 2014-11-29 DIAGNOSIS — R071 Chest pain on breathing: Secondary | ICD-10-CM

## 2014-11-29 DIAGNOSIS — Z86711 Personal history of pulmonary embolism: Secondary | ICD-10-CM

## 2014-11-29 HISTORY — DX: Personal history of pulmonary embolism: Z86.711

## 2014-11-29 HISTORY — DX: Personal history of other venous thrombosis and embolism: Z86.718

## 2014-11-29 HISTORY — DX: Unspecified malaria: B54

## 2014-11-29 LAB — COMPREHENSIVE METABOLIC PANEL
ALK PHOS: 43 U/L (ref 39–117)
ALT: 19 U/L (ref 0–53)
AST: 20 U/L (ref 0–37)
Albumin: 4.1 g/dL (ref 3.5–5.2)
Anion gap: 9 (ref 5–15)
BILIRUBIN TOTAL: 0.8 mg/dL (ref 0.3–1.2)
BUN: 14 mg/dL (ref 6–23)
CHLORIDE: 105 mmol/L (ref 96–112)
CO2: 28 mmol/L (ref 19–32)
Calcium: 9.5 mg/dL (ref 8.4–10.5)
Creatinine, Ser: 1.16 mg/dL (ref 0.50–1.35)
GFR, EST AFRICAN AMERICAN: 86 mL/min — AB (ref >90)
GFR, EST NON AFRICAN AMERICAN: 75 mL/min — AB (ref >90)
Glucose, Bld: 103 mg/dL — ABNORMAL HIGH (ref 70–99)
Potassium: 3.9 mmol/L (ref 3.5–5.1)
Sodium: 142 mmol/L (ref 135–145)
Total Protein: 7.2 g/dL (ref 6.0–8.3)

## 2014-11-29 LAB — CBC WITH DIFFERENTIAL/PLATELET
BASOS PCT: 0 % (ref 0–1)
Basophils Absolute: 0 10*3/uL (ref 0.0–0.1)
Eosinophils Absolute: 0.1 10*3/uL (ref 0.0–0.7)
Eosinophils Relative: 3 % (ref 0–5)
HEMATOCRIT: 41.2 % (ref 39.0–52.0)
HEMOGLOBIN: 14.2 g/dL (ref 13.0–17.0)
LYMPHS PCT: 28 % (ref 12–46)
Lymphs Abs: 0.9 10*3/uL (ref 0.7–4.0)
MCH: 30 pg (ref 26.0–34.0)
MCHC: 34.5 g/dL (ref 30.0–36.0)
MCV: 86.9 fL (ref 78.0–100.0)
MONOS PCT: 14 % — AB (ref 3–12)
Monocytes Absolute: 0.5 10*3/uL (ref 0.1–1.0)
Neutro Abs: 1.9 10*3/uL (ref 1.7–7.7)
Neutrophils Relative %: 55 % (ref 43–77)
PLATELETS: 178 10*3/uL (ref 150–400)
RBC: 4.74 MIL/uL (ref 4.22–5.81)
RDW: 12.6 % (ref 11.5–15.5)
WBC: 3.4 10*3/uL — ABNORMAL LOW (ref 4.0–10.5)

## 2014-11-29 LAB — TROPONIN I: Troponin I: <0.03 ng/mL (ref <0.031)

## 2014-11-29 LAB — D-DIMER, QUANTITATIVE (NOT AT ARMC): D-Dimer, Quant: 0.86 ug/mL-FEU — ABNORMAL HIGH (ref 0.00–0.48)

## 2014-11-29 MED ORDER — RIVAROXABAN 15 MG PO TABS
15.0000 mg | ORAL_TABLET | Freq: Once | ORAL | Status: DC
  Filled 2014-11-29: qty 1

## 2014-11-29 MED ORDER — LIDOCAINE 5 % EX PTCH
1.0000 | MEDICATED_PATCH | Freq: Every day | CUTANEOUS | Status: DC
  Administered 2014-11-30: 1 via TRANSDERMAL
  Filled 2014-11-29 (×2): qty 1

## 2014-11-29 MED ORDER — LOSARTAN POTASSIUM-HCTZ 50-12.5 MG PO TABS: 1.0000 | ORAL_TABLET | Freq: Every day | ORAL | Status: DC

## 2014-11-29 MED ORDER — KETOROLAC TROMETHAMINE 15 MG/ML IJ SOLN: 15.0000 mg | Freq: Three times a day (TID) | INTRAMUSCULAR | Status: DC | PRN

## 2014-11-29 MED ORDER — ATOVAQUONE-PROGUANIL HCL 250-100 MG PO TABS: ORAL_TABLET | ORAL | Status: DC

## 2014-11-29 MED ORDER — ACETAMINOPHEN 650 MG RE SUPP
650.0000 mg | Freq: Four times a day (QID) | RECTAL | Status: DC | PRN
Start: 2014-11-29 — End: 2014-11-30

## 2014-11-29 MED ORDER — IOHEXOL 350 MG/ML SOLN
100.0000 mL | Freq: Once | INTRAVENOUS | Status: AC | PRN
  Administered 2014-11-29: 100 mL via INTRAVENOUS

## 2014-11-29 MED ORDER — ONDANSETRON HCL 4 MG/2ML IJ SOLN: 4.0000 mg | Freq: Four times a day (QID) | INTRAMUSCULAR | Status: DC | PRN

## 2014-11-29 MED ORDER — ONDANSETRON HCL 4 MG PO TABS: 4.0000 mg | ORAL_TABLET | Freq: Four times a day (QID) | ORAL | Status: DC | PRN

## 2014-11-29 MED ORDER — SODIUM CHLORIDE 0.9 % IJ SOLN
3.0000 mL | Freq: Two times a day (BID) | INTRAMUSCULAR | Status: DC
  Administered 2014-11-29 – 2014-11-30 (×2): 3 mL via INTRAVENOUS

## 2014-11-29 MED ORDER — ACETAMINOPHEN 325 MG PO TABS: 650.0000 mg | ORAL_TABLET | Freq: Four times a day (QID) | ORAL | Status: DC | PRN

## 2014-11-29 MED ORDER — KETOROLAC TROMETHAMINE 30 MG/ML IJ SOLN
30.0000 mg | Freq: Once | INTRAMUSCULAR | Status: AC
  Administered 2014-11-29: 30 mg via INTRAVENOUS
  Filled 2014-11-29: qty 1

## 2014-11-29 MED ORDER — ATOVAQUONE-PROGUANIL HCL 250-100 MG PO TABS: 4.0000 | ORAL_TABLET | Freq: Every day | ORAL | Status: DC

## 2014-11-29 NOTE — Progress Notes (Signed)
Subjective:  This chart was scribed for James Lin MD by James Larsen, at [facility]  This patient was seen in room 5 and the patient's care was started at 9:54 AM.    Patient ID: James Larsen, male    DOB: [birth year], 44 y.o.   MRN: [id number]  HPI  HPI Comments: James Larsen is a 44 y.o. male presents to [facility] with a history of malaria. Patient caught James Larsen when he was in [place] visiting his family recently.  He came back nine days ago and started having body aches as well as chills and thinks his Malaria may be re-ocurring again. He has not yet started a fever.  The Malaria medication he received in [place] required him to take it  four times a day.  He currently denies a cough or runny nose.  Patient has no history of diabetes.  He has no other complaints today.   Hypertension:   He takes losartan for  his high blood pressure and lost weight 50lbs so he is trying to get off the medication now.   Wt Readings from Last 3 Encounters:  11/29/14 199 lb 12.8 oz (90.629 kg)  06/18/14 240 lb (108.863 kg)  04/25/14 211 lb 10.3 oz (96 kg)      Past Medical History  Diagnosis Date  . Asthma   . Hypertension   . Renal disorder     kidney stones    Current Outpatient Prescriptions on File Prior to Visit  Medication Sig Dispense Refill  . aspirin EC 325 MG tablet Take 325 mg by mouth daily.    Marland Kitchen losartan-hydrochlorothiazide (HYZAAR) 50-12.5 MG per tablet Take 1 tablet by mouth daily.    Marland Kitchen amitriptyline (ELAVIL) 25 MG tablet Take 25 mg by mouth at bedtime.    Marland Kitchen ibuprofen (ADVIL,MOTRIN) 800 MG tablet Take 1 tablet (800 mg total) by mouth 3 (three) times daily. (Patient not taking: Reported on 11/29/2014) 21 tablet 0  . nitroGLYCERIN (NITROSTAT) 0.4 MG SL tablet Place 0.4 mg under the tongue every 5 (five) minutes as needed for chest pain.    Marland Kitchen oxyCODONE-acetaminophen (PERCOCET) 5-325 MG per tablet Take 1 tablet by mouth every 4  (four) hours as needed for severe pain. (Patient not taking: Reported on 11/29/2014) 20 tablet 0   No current facility-administered medications on file prior to visit.    No Known Allergies   Review of Systems  Constitutional: Positive for chills. Negative for fever.  HENT: Negative for congestion, drooling, facial swelling, nosebleeds, postnasal drip and rhinorrhea.   Eyes: Negative for discharge and redness.  Respiratory: Negative for cough, shortness of breath and wheezing.   Cardiovascular: Negative for chest pain.  Gastrointestinal: Negative for nausea and vomiting.  Musculoskeletal: Positive for myalgias.       Objective:   Physical Exam  Constitutional: He is oriented to person, place, and time. He appears well-developed and well-nourished. No distress.  HENT:  Head: Normocephalic and atraumatic.  Eyes: Conjunctivae and EOM are normal. Pupils are equal, round, and reactive to light. Right eye exhibits no discharge. Left eye exhibits no discharge.  Neck: Neck supple.  Cardiovascular: Normal rate.   Pulmonary/Chest: Effort normal. No respiratory distress.  Musculoskeletal: Normal range of motion.  Neurological: He is alert and oriented to person, place, and time. Coordination normal.  Skin: Skin is warm and dry. No rash noted. He is not diaphoretic.  Psychiatric: He has a normal mood and affect. His behavior  is normal.  Nursing note and vitals reviewed.   BP 130/70 mmHg  Pulse 71  Temp(Src) 98.2 F (36.8 C) (Oral)  Resp 18  Ht 5' 10.5" (1.791 m)  Wt 199 lb 12.8 oz (90.629 kg)  BMI 28.25 kg/m2  SpO2 97%       Assessment & Plan:  I have completed the patient encounter in its entirety as documented by the scribe, with editing by me where necessary. James Larsen P. Laney Pastor, M.D.  Malaria relapse with arthralgia/myalgia  Meds ordered this encounter  Medications  . atovaquone-proguanil (MALARONE) 250-100 MG TABS    Sig: 4 tablets daily for 3 days    Dispense:  12  tablet    Refill:  0   Follow-up if develops fever

## 2014-11-29 NOTE — Patient Instructions (Signed)
°   Atovaquone-proguanil (Malarone)3  Adult tab = 250 mg atovaquone/ 100 mg proguanil  4 adult tabs po qd x 3 days  A

## 2014-11-29 NOTE — ED Notes (Signed)
Pt states he recently went to [place] and contracted malaria over there and was treated for it   Pt states he is having bilateral rib pain that radiates up into his shoulders and states it is hard for him to breathe  Pt states he took aleve prior to coming in  Pt states that has helped with the pain but he wants to be evaluated to see if anything more needs to be done  Pt states he returned to the [place] on April 7th

## 2014-11-29 NOTE — ED Notes (Signed)
MD at bedside. 

## 2014-11-29 NOTE — ED Provider Notes (Signed)
CSN: [id number]     Arrival date & time 11/29/14  1903 History   First MD Initiated Contact with Patient 11/29/14 1937     Chief Complaint  Patient presents with  . chest wall pain      (Consider location/radiation/quality/duration/timing/severity/associated sxs/prior Treatment) HPI Comments: Patient is a 44 year old male with history of hypertension. He presents for evaluation of pain in his right ribs for the past several days. He denies any fevers, chills, or cough. His pain is worse when he breathes, moves.  Patient reports recent travel to [place]. He tells me he was therefore 64 days and return here 10 days ago. While he was there he was told he had malaria and was treated for this. He denies to me he has any fever, headache, cough, congestion, diarrhea, or other symptoms.  Patient is a 44 y.o. male presenting with chest pain. The history is provided by the patient.  Chest Pain Pain location:  R chest Pain quality: sharp   Pain radiates to:  Does not radiate Pain radiates to the back: no   Pain severity:  Moderate Onset quality:  Sudden Duration:  2 days Timing:  Constant Progression:  Worsening Chronicity:  New Context: breathing and movement   Relieved by: Aleve. Worsened by:  Coughing, deep breathing and movement Associated symptoms: no abdominal pain, no cough, no fever, no palpitations and no shortness of breath     Past Medical History  Diagnosis Date  . Asthma   . Hypertension   . Renal disorder     kidney stones  . Malaria    History reviewed. No pertinent past surgical history. Family History  Problem Relation Age of Onset  . Hypertension Mother    History  Substance Use Topics  . Smoking status: Never Smoker   . Smokeless tobacco: Not on file  . Alcohol Use: No     Comment: occ    Review of Systems  Constitutional: Negative for fever.  Respiratory: Negative for cough and shortness of breath.   Cardiovascular: Positive for chest pain. Negative for  palpitations.  Gastrointestinal: Negative for abdominal pain.  All other systems reviewed and are negative.     Allergies  Review of patient's allergies indicates no known allergies.  Home Medications   Prior to Admission medications   Medication Sig Start Date End Date Taking? Authorizing Provider  amitriptyline (ELAVIL) 25 MG tablet Take 25 mg by mouth at bedtime.    Historical Provider, MD  aspirin EC 325 MG tablet Take 325 mg by mouth daily.    Historical Provider, MD  atovaquone-proguanil (MALARONE) 250-100 MG TABS 4 tablets daily for 3 days 11/29/14   Leandrew Koyanagi, MD  ibuprofen (ADVIL,MOTRIN) 800 MG tablet Take 1 tablet (800 mg total) by mouth 3 (three) times daily. Patient not taking: Reported on 11/29/2014 06/19/14   Jeannett Senior, PA-C  losartan-hydrochlorothiazide (HYZAAR) 50-12.5 MG per tablet Take 1 tablet by mouth daily.    Historical Provider, MD  nitroGLYCERIN (NITROSTAT) 0.4 MG SL tablet Place 0.4 mg under the tongue every 5 (five) minutes as needed for chest pain.    Historical Provider, MD  oxyCODONE-acetaminophen (PERCOCET) 5-325 MG per tablet Take 1 tablet by mouth every 4 (four) hours as needed for severe pain. Patient not taking: Reported on 11/29/2014 06/19/14   Tatyana Kirichenko, PA-C   BP 131/84 mmHg  Pulse 70  Temp(Src) 97.8 F (36.6 C) (Oral)  Resp 20  SpO2 100% Physical Exam  Constitutional: He is oriented to  person, place, and time. He appears well-developed and well-nourished. No distress.  HENT:  Head: Normocephalic and atraumatic.  Mouth/Throat: Oropharynx is clear and moist.  Neck: Normal range of motion. Neck supple.  Cardiovascular: Normal rate, regular rhythm and normal heart sounds.   No murmur heard. Pulmonary/Chest: Effort normal and breath sounds normal. No respiratory distress. He has no wheezes.  Abdominal: Soft. Bowel sounds are normal. He exhibits no distension. There is no tenderness.  Musculoskeletal: Normal range of  motion. He exhibits no edema.  Neurological: He is alert and oriented to person, place, and time.  Skin: Skin is warm and dry. He is not diaphoretic.  Nursing note and vitals reviewed.   ED Course  Procedures (including critical care time) Labs Review Labs Reviewed - No data to display  Imaging Review No results found.   EKG Interpretation None      MDM   Final diagnoses:  None    Patient is a 44 year old male recent trip to [place]. He returned 10 days ago. He is now having discomfort in his right ribs and upper back. This is worse with speaking and deep breath. His d-dimer is elevated prompting a CT scan of his chest. This revealed an embolus within the right lower segmental branch with wedge-shaped airspace opacity insistent with a pulmonary infarct. I discussed this with Dr. Posey Pronto from the hospitalist service who is recommending observation and anticoagulation with xarelto.    Veryl Speak, MD 11/29/14 [phone]

## 2014-11-29 NOTE — ED Notes (Signed)
Pt requesting Toradol for gradually increasing chest wall pain.

## 2014-11-29 NOTE — ED Notes (Signed)
Margaret of Infection Control reports no need for Droplet Precaution based upon negative s/s suspecting Ebola.

## 2014-11-30 ENCOUNTER — Encounter (HOSPITAL_COMMUNITY): Payer: Self-pay | Admitting: Nurse Practitioner

## 2014-11-30 DIAGNOSIS — D72819 Decreased white blood cell count, unspecified: Secondary | ICD-10-CM | POA: Insufficient documentation

## 2014-11-30 DIAGNOSIS — I2699 Other pulmonary embolism without acute cor pulmonale: Secondary | ICD-10-CM

## 2014-11-30 DIAGNOSIS — I10 Essential (primary) hypertension: Secondary | ICD-10-CM | POA: Diagnosis not present

## 2014-11-30 DIAGNOSIS — B54 Unspecified malaria: Secondary | ICD-10-CM | POA: Diagnosis not present

## 2014-11-30 LAB — CBC WITH DIFFERENTIAL/PLATELET
Basophils Absolute: 0 10*3/uL (ref 0.0–0.1)
Basophils Relative: 1 % (ref 0–1)
EOS ABS: 0.1 10*3/uL (ref 0.0–0.7)
EOS PCT: 3 % (ref 0–5)
HCT: 40.9 % (ref 39.0–52.0)
Hemoglobin: 13.4 g/dL (ref 13.0–17.0)
LYMPHS ABS: 1.1 10*3/uL (ref 0.7–4.0)
Lymphocytes Relative: 36 % (ref 12–46)
MCH: 28.8 pg (ref 26.0–34.0)
MCHC: 32.8 g/dL (ref 30.0–36.0)
MCV: 87.8 fL (ref 78.0–100.0)
Monocytes Absolute: 0.4 10*3/uL (ref 0.1–1.0)
Monocytes Relative: 12 % (ref 3–12)
NEUTROS PCT: 48 % (ref 43–77)
Neutro Abs: 1.5 10*3/uL — ABNORMAL LOW (ref 1.7–7.7)
PLATELETS: 175 10*3/uL (ref 150–400)
RBC: 4.66 MIL/uL (ref 4.22–5.81)
RDW: 12.6 % (ref 11.5–15.5)
WBC: 3.2 10*3/uL — ABNORMAL LOW (ref 4.0–10.5)

## 2014-11-30 LAB — BASIC METABOLIC PANEL
ANION GAP: 5 (ref 5–15)
BUN: 11 mg/dL (ref 6–23)
CHLORIDE: 102 mmol/L (ref 96–112)
CO2: 29 mmol/L (ref 19–32)
Calcium: 8.6 mg/dL (ref 8.4–10.5)
Creatinine, Ser: 1.16 mg/dL (ref 0.50–1.35)
GFR calc Af Amer: 86 mL/min — ABNORMAL LOW (ref >90)
GFR calc non Af Amer: 75 mL/min — ABNORMAL LOW (ref >90)
Glucose, Bld: 88 mg/dL (ref 70–99)
Potassium: 4 mmol/L (ref 3.5–5.1)
SODIUM: 136 mmol/L (ref 135–145)

## 2014-11-30 LAB — TROPONIN I

## 2014-11-30 MED ORDER — LOSARTAN POTASSIUM 50 MG PO TABS
50.0000 mg | ORAL_TABLET | Freq: Every day | ORAL | Status: DC
  Administered 2014-11-30: 50 mg via ORAL
  Filled 2014-11-30: qty 1

## 2014-11-30 MED ORDER — ENOXAPARIN SODIUM 150 MG/ML ~~LOC~~ SOLN
140.0000 mg | SUBCUTANEOUS | Status: DC
  Administered 2014-11-30: 140 mg via SUBCUTANEOUS
  Filled 2014-11-30: qty 1

## 2014-11-30 MED ORDER — HYDROCHLOROTHIAZIDE 12.5 MG PO CAPS
12.5000 mg | ORAL_CAPSULE | Freq: Every day | ORAL | Status: DC
Start: 2014-11-30 — End: 2014-11-30
  Administered 2014-11-30: 12.5 mg via ORAL
  Filled 2014-11-30: qty 1

## 2014-11-30 MED ORDER — ENOXAPARIN SODIUM 150 MG/ML ~~LOC~~ SOLN: 1.5000 mg/kg | SUBCUTANEOUS | Status: DC

## 2014-11-30 MED ORDER — APIXABAN 2.5 MG PO TABS: 5.0000 mg | ORAL_TABLET | Freq: Two times a day (BID) | ORAL | Status: DC

## 2014-11-30 MED ORDER — APIXABAN 2.5 MG PO TABS
10.0000 mg | ORAL_TABLET | Freq: Two times a day (BID) | ORAL | Status: DC
  Administered 2014-11-30: 10 mg via ORAL
  Filled 2014-11-30: qty 2

## 2014-11-30 NOTE — Progress Notes (Signed)
ANTICOAGULATION CONSULT NOTE - Initial Consult  Pharmacy Consult for apixaban Indication: pulmonary embolus  No Known Allergies  Patient Measurements: Height: 5\' 11"  (180.3 cm) Weight: 202 lb 12.8 oz (91.989 kg) IBW/kg (Calculated) : 75.3 Heparin Dosing Weight:   Vital Signs: Temp: 98 F (36.7 C) (04/18 0113) Temp Source: Oral (04/18 0113) BP: 130/92 mmHg (04/18 0113) Pulse Rate: 62 (04/18 0113)  Labs:  Recent Labs  11/29/14 2016 11/29/14 2352 11/30/14 0501  HGB 14.2  --  13.4  HCT 41.2  --  40.9  PLT 178  --  175  CREATININE 1.16  --   --   TROPONINI <0.03 <0.03  --     Estimated Creatinine Clearance: 93.3 mL/min (by C-G formula based on Cr of 1.16).   Medical History: Past Medical History  Diagnosis Date  . Asthma   . Hypertension   . Renal disorder     kidney stones  . Malaria     Medications:  Scheduled:  . apixaban  10 mg Oral BID  . [START ON 12/06/2014] apixaban  5 mg Oral BID  . atovaquone-proguanil  4 tablet Oral Daily  . hydrochlorothiazide  12.5 mg Oral Daily  . lidocaine  1 patch Transdermal QHS  . losartan  50 mg Oral Daily  . sodium chloride  3 mL Intravenous Q12H    Assessment: Patient with recent air travel from [place] in ED with chest pain.  (+) D-Dimer and CT-chest shows embolus.    Goal of Therapy:  Apixaban dosed based on patient weight and renal function     Plan:  apixaban 10mg  po bid x 7days, then 5mg  po bid  Tyler Deis, Kaytlen Lightsey Crowford 11/30/2014,5:40 AM

## 2014-11-30 NOTE — Progress Notes (Addendum)
*  Preliminary Results* Bilateral lower extremity venous duplex completed. The right lower extremity is positive for acute occlusive deep vein thrombosis involving a single right peroneal vein. The left lower extremity is negative for deep vein thrombosis. There is no evidence of Baker's cyst bilaterally.  Preliminary results discussed with Dr. Charlies Silvers.  11/30/2014  Maudry Mayhew, RVT, RDCS, RDMS

## 2014-11-30 NOTE — Discharge Instructions (Signed)
Pulmonary Embolism A pulmonary (lung) embolism (PE) is a blood clot that has traveled to the lung and results in a blockage of blood flow in the affected lung. Most clots come from deep veins in the legs or pelvis. PE is a dangerous and potentially life-threatening condition that can be treated if identified. CAUSES Blood clots form in a vein for different reasons. Usually several things cause blood clots. They include:  The flow of blood slows down.  The inside of the vein is damaged in some way.  The person has a condition that makes the blood clot more easily. RISK FACTORS Some people are more likely than others to develop PE. Risk factors include:   Smoking.  Being overweight (obese).  Sitting or lying still for a long time. This includes long-distance travel, paralysis, or recovery from an illness or surgery. Other factors that increase risk are:   Older age, especially over 75 years of age.  Having a family history of blood clots or if you have already had a blood clot.  Having major or lengthy surgery. This is especially true for surgery on the hip, knee, or belly (abdomen). Hip surgery is particularly high risk.  Having a long, thin tube (catheter) placed inside a vein during a medical procedure.  Breaking a hip or leg.  Having cancer or cancer treatment.  Medicines containing the male hormone estrogen. This includes birth control pills and hormone replacement therapy.  Other circulation or heart problems.  Pregnancy and childbirth.  Hormone changes make the blood clot more easily during pregnancy.  The fetus puts pressure on the veins of the pelvis.  There is a risk of injury to veins during delivery or a caesarean delivery. The risk is highest just after childbirth.  PREVENTION   Exercise the legs regularly. Take a brisk 30 minute walk every day.  Maintain a weight that is appropriate for your height.  Avoid sitting or lying in bed for long periods of  time without moving your legs.  Women, particularly those over the age of 35 years, should consider the risks and benefits of taking estrogen medicines, including birth control pills.  Do not smoke, especially if you take estrogen medicines.  Long-distance travel can increase your risk. You should exercise your legs by walking or pumping the muscles every hour.  Many of the risk factors above relate to situations that exist with hospitalization, either for illness, injury, or elective surgery. Prevention may include medical and nonmedical measures.   Your health care provider will assess you for the need for venous thromboembolism prevention when you are admitted to the hospital. If you are having surgery, your surgeon will assess you the day of or day after surgery.  SYMPTOMS  The symptoms of a PE usually start suddenly and include:  Shortness of breath.  Coughing.  Coughing up blood or blood-tinged mucus.  Chest pain. Pain is often worse with deep breaths.  Rapid heartbeat. DIAGNOSIS  If a PE is suspected, your health care provider will take a medical history and perform a physical exam. Other tests that may be required include:  Blood tests, such as studies of the clotting properties of your blood.  Imaging tests, such as ultrasound, CT, MRI, and other tests to see if you have clots in your legs or lungs.  An electrocardiogram. This can look for heart strain from blood clots in the lungs. TREATMENT   The most common treatment for a PE is blood thinning (anticoagulant) medicine, which reduces   the blood's tendency to clot. Anticoagulants can stop new blood clots from forming and old clots from growing. They cannot dissolve existing clots. Your body does this by itself over time. Anticoagulants can be given by mouth, through an intravenous (IV) tube, or by injection. Your health care provider will determine the best program for you.  Less commonly, clot-dissolving medicines  (thrombolytics) are used to dissolve a PE. They carry a high risk of bleeding, so they are used mainly in severe cases.  Very rarely, a blood clot in the leg needs to be removed surgically.  If you are unable to take anticoagulants, your health care provider may arrange for you to have a filter placed in a main vein in your abdomen. This filter prevents clots from traveling to your lungs. HOME CARE INSTRUCTIONS   Take all medicines as directed by your health care provider.  Learn as much as you can about DVT.  Wear a medical alert bracelet or carry a medical alert card.  Ask your health care provider how soon you can go back to normal activities. It is important to stay active to prevent blood clots. If you are on anticoagulant medicine, avoid contact sports.  It is very important to exercise. This is especially important while traveling, sitting, or standing for long periods of time. Exercise your legs by walking or by tightening and relaxing your leg muscles regularly. Take frequent walks.  You may need to wear compression stockings. These are tight elastic stockings that apply pressure to the lower legs. This pressure can help keep the blood in the legs from clotting. Taking Warfarin Warfarin is a daily medicine that is taken by mouth. Your health care provider will advise you on the length of treatment (usually 3-6 months, sometimes lifelong). If you take warfarin:  Understand how to take warfarin and foods that can affect how warfarin works in your body.  Too much and too little warfarin are both dangerous. Too much warfarin increases the risk of bleeding. Too little warfarin continues to allow the risk for blood clots. Warfarin and Regular Blood Testing While taking warfarin, you will need to have regular blood tests to measure your blood clotting time. These blood tests usually include both the prothrombin time (PT) and international normalized ratio (INR) tests. The PT and INR  results allow your health care provider to adjust your dose of warfarin. It is very important that you have your PT and INR tested as often as directed by your health care provider.  Warfarin and Your Diet Avoid major changes in your diet, or notify your health care provider before changing your diet. Arrange a visit with a registered dietitian to answer your questions. Many foods, especially foods high in vitamin K, can interfere with warfarin and affect the PT and INR results. You should eat a consistent amount of foods high in vitamin K. Foods high in vitamin K include:   Spinach, kale, broccoli, cabbage, collard and turnip greens, Brussels sprouts, peas, cauliflower, seaweed, and parsley.  Beef and pork liver.  Green tea.  Soybean oil. Warfarin with Other Medicines Many medicines can interfere with warfarin and affect the PT and INR results. You must:  Tell your health care provider about any and all medicines, vitamins, and supplements you take, including aspirin and other over-the-counter anti-inflammatory medicines. Be especially cautious with aspirin and anti-inflammatory medicines. Ask your health care provider before taking these.  Do not take or discontinue any prescribed or over-the-counter medicine except on the advice   of your health care provider or pharmacist. Warfarin Side Effects Warfarin can have side effects, such as easy bruising and difficulty stopping bleeding. Ask your health care provider or pharmacist about other side effects of warfarin. You will need to:  Hold pressure over cuts for longer than usual.  Notify your dentist and other health care providers that you are taking warfarin before you undergo any procedures where bleeding may occur. Warfarin with Alcohol and Tobacco   Drinking alcohol frequently can increase the effect of warfarin, leading to excess bleeding. It is best to avoid alcoholic drinks or consume only very small amounts while taking warfarin.  Notify your health care provider if you change your alcohol intake.  Do not use any tobacco products including cigarettes, chewing tobacco, or electronic cigarettes. If you smoke, quit. Ask your health care provider for help with quitting smoking. Alternative Medicines to Warfarin: Factor Xa Inhibitor Medicines  These blood thinning medicines are taken by mouth, usually for several weeks or longer. It is important to take the medicine every single day, at the same time each day.  There are no regular blood tests required when using these medicines.  There are fewer food and drug interactions than with warfarin.  The side effects of this class of medicine is similar to that of warfarin, including excessive bruising or bleeding. Ask your health care provider or pharmacist about other potential side effects. SEEK MEDICAL CARE IF:   You notice a rapid heartbeat.  You feel weaker or more tired than usual.  You feel faint.  You notice increased bruising.  Your symptoms are not getting better in the time expected.  You are having side effects of medicine. SEEK IMMEDIATE MEDICAL CARE IF:   You have chest pain.  You have trouble breathing.  You have new or increased swelling or pain in one leg.  You cough up blood.  You notice blood in vomit, in a bowel movement, or in urine.  You have a fever. Symptoms of PE may represent a serious problem that is an emergency. Do not wait to see if the symptoms will go away. Get medical help right away. Call your local emergency services (911 in the [place]). Do not drive yourself to the hospital. Document Released: 07/28/2000 Document Revised: 12/15/2013 Document Reviewed: 08/11/2013 Exit[employer] Patient Information 2015 [employer]. This information is not intended to replace advice given to you by your health care provider. Make sure you discuss any questions you have with your health care provider.  

## 2014-11-30 NOTE — Progress Notes (Signed)
D/C forms completed and handed to pt. Pt verbalizes understanding. Rx and appointments given to pt.

## 2014-11-30 NOTE — Progress Notes (Signed)
ANTICOAGULATION CONSULT NOTE - Initial Consult  Pharmacy Consult for Lovenox Indication: pulmonary embolus  No Known Allergies  Patient Measurements: Height: 5\' 11"  (180.3 cm) Weight: 202 lb (91.627 kg) IBW/kg (Calculated) : 75.3  Vital Signs: Temp: 98.7 F (37.1 C) (04/18 0604) Temp Source: Oral (04/18 0604) BP: 119/72 mmHg (04/18 0604) Pulse Rate: 56 (04/18 0604)  Labs:  Recent Labs  11/29/14 2016 11/29/14 2352 11/30/14 0501  HGB 14.2  --  13.4  HCT 41.2  --  40.9  PLT 178  --  175  CREATININE 1.16  --  1.16  TROPONINI <0.03 <0.03 <0.03    Estimated Creatinine Clearance: 93 mL/min (by C-G formula based on Cr of 1.16).   Medical History: Past Medical History  Diagnosis Date  . Asthma   . Hypertension   . Renal disorder     kidney stones  . Malaria     Medications:  Scheduled:  . atovaquone-proguanil  4 tablet Oral Daily  . enoxaparin (LOVENOX) injection  140 mg Subcutaneous Q24H  . hydrochlorothiazide  12.5 mg Oral Daily  . lidocaine  1 patch Transdermal QHS  . losartan  50 mg Oral Daily  . sodium chloride  3 mL Intravenous Q12H    Assessment: Patient with recent air travel from [place] in ED with chest pain.  (+) D-Dimer and CT-chest shows embolus.  Pharmacy initially consulted to dose apixaban, but changed to Lovenox on 4/18 per MD request.  CBC: Hgb and Plt are stable/WNL Renal: CrCl ~ 93 ml/min First and only dose of apixaban given 4/18 0038 AM.  Goal of Therapy:  Lovenox dosed based on patient weight and renal function   Plan:   Lovenox 140mg  SQ q24h  Start first dose 12 hours after last apixaban dose  Follow up renal function and CBC  Follow up long-term anticoagulation plans.  Gretta Arab PharmD, BCPS Pager [phone] 11/30/2014 10:12 AM

## 2014-11-30 NOTE — Discharge Summary (Signed)
Physician Discharge Summary  James Larsen IDP:[id number] DOB: [birth year] DOA: 11/29/2014  PCP: Philis Fendt, MD  Admit date: 11/29/2014 Discharge date: 11/30/2014  Recommendations for Outpatient Follow-up:  1. Pt will continue Lovenox on discharge for PE and DVT 2. The rest of his medications are the same as per piror regimen   Discharge Diagnoses:  Principal Problem:   Acute pulmonary embolism Active Problems:   Chest pain   Hypertension   Malaria-ghana 2016    Discharge Condition: stable   Diet recommendation: as tolerated   History of present illness:  44 y.o. male with past medical history of hypertension, malaria who presented to [facility] ED with right sided chest pain, 10/10 in intensity started about few days prior to this admission. Pain was sharp , intermittent, non radiating and present at rest and worse with deep breathing. Pt tried ibuprofen but it provided little symptomatic relief. Pt reports pain still there on admission. Additional assocaited symptoms are dizziness but no loss of consciousness. No shortness of breath. No fevers. No cough. Pt recently traveled to [place] and returned 10 days prior to this admission.   On admission, pt was found to have pulmonary embolism and LE DVT. He preferred to be on Lovenox and this is the medication he was discharged on.   Hospital Course:   Principal Problem:   Acute pulmonary embolism / LE DVT - Pt found to have an acute right lower lobe segmental branch pulmonary embolism. Possible triggering event recent travel - He was started on Eliquis on admission but this am he considered Lovenox which is what is being prescribed on discharge.  - LE doppler is positive for acute occlusive deep vein thrombosis involving a single right peroneal vein. The left lower extremity is negative for deep vein thrombosis.   Active Problems:   Chest pain - Pleuritic - Trop x 3 negative  - Due to pulmonary embolism  - Resolved this am      Hypertension, essential  - May resume losartan-hctz on discharge     Malaria-ghana 2016 - Continue atovaquone- proguanil    Signed:  Leisa Lenz, MD  [facility] 11/30/2014, 10:11 AM  Pager #: [phone]   Procedures:  LE doppler 11/30/2014   Consultations:  None   Discharge Exam: Filed Vitals:   11/30/14 0604  BP: 119/72  Pulse: 56  Temp: 98.7 F (37.1 C)  Resp: 18   Filed Vitals:   11/29/14 2335 11/30/14 0049 11/30/14 0113 11/30/14 0604  BP: 120/86 137/95 130/92 119/72  Pulse: 59 65 62 56  Temp:   98 F (36.7 C) 98.7 F (37.1 C)  TempSrc:   Oral Oral  Resp: 22 21 16 18   Height:   5\' 11"  (1.803 m)   Weight:   91.989 kg (202 lb 12.8 oz) 91.627 kg (202 lb)  SpO2: 100% 100% 100% 100%    General: Pt is alert, follows commands appropriately, not in acute distress Cardiovascular: Regular rate and rhythm, S1/S2 +, no murmurs Respiratory: Clear to auscultation bilaterally, no wheezing, no crackles, no rhonchi Abdominal: Soft, non tender, non distended, bowel sounds +, no guarding Extremities: no edema, no cyanosis, pulses palpable bilaterally DP and PT Neuro: Grossly nonfocal  Discharge Instructions  Discharge Instructions    Call MD for:  difficulty breathing, headache or visual disturbances    Complete by:  As directed      Call MD for:  persistant nausea and vomiting    Complete by:  As directed  Call MD for:  severe uncontrolled pain    Complete by:  As directed      Diet - low sodium heart healthy    Complete by:  As directed      Discharge instructions    Complete by:  As directed   1. Please continue Lovenox 140 mg daily for treatment of pulmonary embolism.     Increase activity slowly    Complete by:  As directed             Medication List    STOP taking these medications        ibuprofen 800 MG tablet  Commonly known as:  ADVIL,MOTRIN     oxyCODONE-acetaminophen 5-325 MG per tablet  Commonly known as:  PERCOCET       TAKE these medications        aspirin EC 325 MG tablet  Take 325 mg by mouth daily.     atovaquone-proguanil 250-100 MG Tabs  Commonly known as:  MALARONE  4 tablets daily for 3 days     enoxaparin 150 MG/ML injection  Commonly known as:  LOVENOX  Inject 0.92 mLs (140 mg total) into the skin daily.     losartan-hydrochlorothiazide 50-12.5 MG per tablet  Commonly known as:  HYZAAR  Take 1 tablet by mouth daily.     naproxen sodium 220 MG tablet  Commonly known as:  ANAPROX  Take 440-880 mg by mouth every 6 (six) hours as needed (pain).     nitroGLYCERIN 0.4 MG SL tablet  Commonly known as:  NITROSTAT  Place 0.4 mg under the tongue every 5 (five) minutes as needed for chest pain.           Follow-up Information    Follow up with AVBUERE,EDWIN A, MD. Schedule an appointment as soon as possible for a visit in 1 week.   Specialty:  Internal Medicine   Why:  Follow up appt after recent hospitalization   Contact information:   [place] [address] [phone]        The results of significant diagnostics from this hospitalization (including imaging, microbiology, ancillary and laboratory) are listed below for reference.    Significant Diagnostic Studies: Dg Chest 2 View 11/29/2014  Shallower inspiration with patchy bibasilar opacities which may represent atelectasis versus infection. Suspect small right pleural effusion.   Electronically Signed   By: Logan Bores   On: 11/29/2014 20:40   Ct Angio Chest Pe W/cm &/or Wo Cm 11/29/2014    Acute right lower lobe segmental branch pulmonary embolism. Peripheral wedge-shaped airspace opacity may reflect pulmonary infarction. No CT evidence for right ventricular heart strain.  Small right pleural effusion. Associated airspace consolidation; atelectasis versus pneumonia.  Bilateral lower lobe peribronchial thickening may be infectious or inflammatory.     Microbiology: No results found for this or any previous  visit (from the past 240 hour(s)).   Labs: Basic Metabolic Panel:  Recent Labs Lab 11/29/14 2016 11/30/14 0501  NA 142 136  K 3.9 4.0  CL 105 102  CO2 28 29  GLUCOSE 103* 88  BUN 14 11  CREATININE 1.16 1.16  CALCIUM 9.5 8.6   Liver Function Tests:  Recent Labs Lab 11/29/14 2016  AST 20  ALT 19  ALKPHOS 43  BILITOT 0.8  PROT 7.2  ALBUMIN 4.1   No results for input(s): LIPASE, AMYLASE in the last 168 hours. No results for input(s): AMMONIA in the last 168 hours. CBC:  Recent  Labs Lab 11/29/14 2016 11/30/14 0501  WBC 3.4* 3.2*  NEUTROABS 1.9 1.5*  HGB 14.2 13.4  HCT 41.2 40.9  MCV 86.9 87.8  PLT 178 175   Cardiac Enzymes:  Recent Labs Lab 11/29/14 2016 11/29/14 2352 11/30/14 0501  TROPONINI <0.03 <0.03 <0.03   BNP: BNP (last 3 results) No results for input(s): BNP in the last 8760 hours.  ProBNP (last 3 results) No results for input(s): PROBNP in the last 8760 hours.  CBG: No results for input(s): GLUCAP in the last 168 hours.  Time coordinating discharge: Over 30 minutes

## 2014-11-30 NOTE — H&P (Signed)
[facility] History and Physical  Patient: James Larsen  MRN: [id number]  DOB: 25-Jun-1971  DOS: the patient was seen and examined on 11/29/2014 PCP: Philis Fendt, MD   Chief Complaint:  Right-sided chest pain  HPI: James Larsen is a 44 y.o. male with Past medical history of hypertension. The patient is presenting with complaints of right-sided chest pain. The pain started earlier last week. The pain was pleuritic in nature worsening with deep breathing and feels like sharp pain. Pain was also worsening with movement. Today the pain was 10 out of 10 in intensity and was not improving and therefore he decided to come to the ER. He was recently visited [place] for 60 days and He returned here 10 days ago. He was treated with malaria there. He denies any fever denies any headache denies any cough denies any hemoptysis denies any abdominal pain nausea or vomiting denies any diarrhea or constipation. He mentions is compliant with all his medications. Due to his chest pain and feeling off for dizziness he has seen his PCP who started treating him with malaria empirically and he has completed one dose of antibiotic already. He does not have any significant family history of blood clots in early ages but his grandmother was recently diagnosed blood clot. Denies any prior blood clot history.  The patient is coming from home. And at his baseline independent for most of his ADL.  Review of Systems: as mentioned in the history of present illness.  A comprehensive review of the other systems is negative.  Past Medical History  Diagnosis Date  . Asthma   . Hypertension   . Renal disorder     kidney stones  . Malaria    History reviewed. No pertinent past surgical history. Social History:  reports that he has never smoked. He does not have any smokeless tobacco history on file. He reports that he does not drink alcohol or use illicit drugs.  No Known Allergies  Family History    Problem Relation Age of Onset  . Hypertension Mother     Prior to Admission medications   Medication Sig Start Date End Date Taking? Authorizing Provider  aspirin EC 325 MG tablet Take 325 mg by mouth daily.   Yes Historical Provider, MD  atovaquone-proguanil (MALARONE) 250-100 MG TABS 4 tablets daily for 3 days Patient taking differently: Take 4 tablets by mouth daily.  11/29/14  Yes Leandrew Koyanagi, MD  losartan-hydrochlorothiazide (HYZAAR) 50-12.5 MG per tablet Take 1 tablet by mouth daily.   Yes Historical Provider, MD  naproxen sodium (ANAPROX) 220 MG tablet Take 440-880 mg by mouth every 6 (six) hours as needed (pain).   Yes Historical Provider, MD  ibuprofen (ADVIL,MOTRIN) 800 MG tablet Take 1 tablet (800 mg total) by mouth 3 (three) times daily. Patient not taking: Reported on 11/29/2014 06/19/14   Tatyana Kirichenko, PA-C  nitroGLYCERIN (NITROSTAT) 0.4 MG SL tablet Place 0.4 mg under the tongue every 5 (five) minutes as needed for chest pain.    Historical Provider, MD  oxyCODONE-acetaminophen (PERCOCET) 5-325 MG per tablet Take 1 tablet by mouth every 4 (four) hours as needed for severe pain. Patient not taking: Reported on 11/29/2014 06/19/14   Jeannett Senior, PA-C    Physical Exam: Filed Vitals:   11/29/14 2151 11/29/14 2335 11/30/14 0049 11/30/14 0113  BP: 118/78 120/86 137/95 130/92  Pulse: 65 59 65 62  Temp:    98 F (36.7 C)  TempSrc:    Oral  Resp: 17  22 21 16   Height:    5\' 11"  (1.803 m)  Weight:    91.989 kg (202 lb 12.8 oz)  SpO2: 99% 100% 100% 100%    General: Alert, Awake and Oriented to Time, Place and Person. Appear in mild distress Eyes: PERRL ENT: Oral Mucosa clear moist. Neck: no JVD Cardiovascular: S1 and S2 Present, no Murmur, Peripheral Pulses Present Respiratory: Bilateral Air entry equal and Decreased,  Clear to Auscultation, noCrackles, no wheezes Abdomen: Bowel Sound present, Soft and non tender Skin: no Rash Extremities: no Pedal edema,  no calf tenderness Neurologic: Grossly no focal neuro deficit.  Labs on Admission:  CBC:  Recent Labs Lab 11/29/14 2016  WBC 3.4*  NEUTROABS 1.9  HGB 14.2  HCT 41.2  MCV 86.9  PLT 178    CMP     Component Value Date/Time   NA 142 11/29/2014 2016   K 3.9 11/29/2014 2016   CL 105 11/29/2014 2016   CO2 28 11/29/2014 2016   GLUCOSE 103* 11/29/2014 2016   BUN 14 11/29/2014 2016   CREATININE 1.16 11/29/2014 2016   CALCIUM 9.5 11/29/2014 2016   PROT 7.2 11/29/2014 2016   ALBUMIN 4.1 11/29/2014 2016   AST 20 11/29/2014 2016   ALT 19 11/29/2014 2016   ALKPHOS 43 11/29/2014 2016   BILITOT 0.8 11/29/2014 2016   GFRNONAA 75* 11/29/2014 2016   GFRAA 86* 11/29/2014 2016    No results for input(s): LIPASE, AMYLASE in the last 168 hours.   Recent Labs Lab 11/29/14 2016 11/29/14 2352  TROPONINI <0.03 <0.03   BNP (last 3 results) No results for input(s): BNP in the last 8760 hours.  ProBNP (last 3 results) No results for input(s): PROBNP in the last 8760 hours.   Radiological Exams on Admission: Dg Chest 2 View  11/29/2014   CLINICAL DATA:  Chest/bilateral rib pain radiating into the shoulders with difficulty breathing. History asthma. Recently treated for malaria.  EXAM: CHEST  2 VIEW  COMPARISON:  04/14/2014  FINDINGS: The cardiac silhouette is upper limits of normal in size, accentuated by shallower inspiration compared to the prior study. There is new patchy opacity in the right greater than left lung bases. A mall right pleural effusion is likely present. No pneumothorax is seen. No acute osseous abnormality is identified.  IMPRESSION: Shallower inspiration with patchy bibasilar opacities which may represent atelectasis versus infection. Suspect small right pleural effusion.   Electronically Signed   By: Logan Bores   On: 11/29/2014 20:40   Ct Angio Chest Pe W/cm &/or Wo Cm  11/29/2014   CLINICAL DATA:  Rib pain, difficulty breathing. Recent international travel and  diagnosis of malaria.  EXAM: CT ANGIOGRAPHY CHEST WITH CONTRAST  TECHNIQUE: Multidetector CT imaging of the chest was performed using the standard protocol during bolus administration of intravenous contrast. Multiplanar CT image reconstructions and MIPs were obtained to evaluate the vascular anatomy.  CONTRAST:  158mL OMNIPAQUE IOHEXOL 350 MG/ML SOLN  COMPARISON:  11/29/2014 radiograph  FINDINGS: There is a right lower lobe segmental branch filling defect (series 11, image 158/254). Peripheral to this, there is a wedge-shaped airspace consolidation. No additional pulmonary arterial branch filling defect identified though there is quite a bit of mixing artifact which limits sensitivity.  Normal caliber thoracic aorta. No aortic dissection. Normal heart size. No CT evidence for right ventricular heart strain. Trace pericardial effusion. Small right pleural effusion. Associated airspace consolidation. Right hilar lymph nodes are presumably reactive. No mediastinal or axillary lymphadenopathy.  Upper  abdominal images show nothing acute. The spleen is incompletely imaged. The adrenal glands are not imaged. Lower lobe peribronchial thickening and impacted bronchioles bilaterally. Central airways are patent. No pneumothorax.  Sclerotic focus within the lateral right sixth rib is most in keeping with a bone island.  Review of the MIP images confirms the above findings.  IMPRESSION: Acute right lower lobe segmental branch pulmonary embolism. Peripheral wedge-shaped airspace opacity may reflect pulmonary infarction. No CT evidence for right ventricular heart strain.  Small right pleural effusion. Associated airspace consolidation; atelectasis versus pneumonia.  Bilateral lower lobe peribronchial thickening may be infectious or inflammatory.  Critical Value/emergent results were called by telephone at the time of interpretation on 11/29/2014 at 10:43 pm to Dr. Veryl Speak , who verbally acknowledged these results.    Electronically Signed   By: Carlos Levering M.D.   On: 11/29/2014 22:44   EKG: Independently reviewed. normal sinus rhythm, nonspecific ST and T waves changes.  Assessment/Plan Principal Problem:   Acute pulmonary embolism Active Problems:   Chest pain   Hypertension   Malaria-ghana 2016   1. Acute pulmonary embolism The patient is presenting with complaints of chest pain. Workup shows that he has elevated d-dimer and a CT scan showing segmental right-sided pulmonary embolism with infarct. He does not have any hypoxia or tachycardia or hemoptysis. He has ongoing chest pain and has been having difficulty managing the pain. With this the patient will be admitted in the hospital. We'll continue him on anticoagulation. Patient after explaining the options of warfarin versus newer oral anticoagulant prefers to be on Eliquis. He'll be started on Eliquis and will be monitored on telemetry. With that lower extremity Doppler in the morning. Follow serial troponin if that is any elevation will also need to get an echocardiogram. Pain management as indicated. At present trying to avoid narcotics.  2. Essential hypertension. Continue home medications. Blood pressure at present stable.  3. Recent history of malaria. Patient was started on a malaria treatment by PCP again. We will finish the 3 day course of antibiotics. Patient was discussed with infection control who mentioned patient does not require any isolation.  Advance goals of care discussion: Full code  on chronic therapeutic anticoagulation. Nutrition: Regular diet  Disposition: Admitted as inpatient, telemetry unit.  Author: Berle Mull, MD [facility] Pager: [phone]   If 7PM-7AM, please contact night-coverage [email or web address] Password [email or web address]

## 2016-04-04 DIAGNOSIS — Z131 Encounter for screening for diabetes mellitus: Secondary | ICD-10-CM | POA: Diagnosis not present

## 2016-04-04 DIAGNOSIS — I10 Essential (primary) hypertension: Secondary | ICD-10-CM | POA: Diagnosis not present

## 2016-04-04 DIAGNOSIS — G5610 Other lesions of median nerve, unspecified upper limb: Secondary | ICD-10-CM | POA: Diagnosis not present

## 2016-04-04 DIAGNOSIS — J4520 Mild intermittent asthma, uncomplicated: Secondary | ICD-10-CM | POA: Diagnosis not present

## 2016-04-04 DIAGNOSIS — Z125 Encounter for screening for malignant neoplasm of prostate: Secondary | ICD-10-CM | POA: Diagnosis not present

## 2016-04-04 DIAGNOSIS — Z13220 Encounter for screening for lipoid disorders: Secondary | ICD-10-CM | POA: Diagnosis not present

## 2016-05-20 ENCOUNTER — Ambulatory Visit (INDEPENDENT_AMBULATORY_CARE_PROVIDER_SITE_OTHER): Payer: BLUE CROSS/BLUE SHIELD | Admitting: Family Medicine

## 2016-05-20 VITALS — BP 120/82 | HR 73 | Temp 98.0°F | Resp 17 | Ht 71.0 in | Wt 212.0 lb

## 2016-05-20 DIAGNOSIS — J019 Acute sinusitis, unspecified: Secondary | ICD-10-CM | POA: Diagnosis not present

## 2016-05-20 MED ORDER — IPRATROPIUM BROMIDE 0.03 % NA SOLN: 2.0000 | Freq: Four times a day (QID) | NASAL | 1 refills | Status: DC

## 2016-05-20 NOTE — Patient Instructions (Addendum)
   IF you received an x-ray today, you will receive an invoice from [facility]. Please contact [facility] at [phone] with questions or concerns regarding your invoice.   IF you received labwork today, you will receive an invoice from [facility]. Please contact [facility] at [phone] with questions or concerns regarding your invoice.   Our billing staff will not be able to assist you with questions regarding bills from these companies.  You will be contacted with the lab results as soon as they are available. The fastest way to get your results is to activate your My Chart account. Instructions are located on the last page of this paperwork. If you have not heard from us regarding the results in 2 weeks, please contact this office.     Sinusitis, Adult Sinusitis is redness, soreness, and inflammation of the paranasal sinuses. Paranasal sinuses are air pockets within the bones of your face. They are located beneath your eyes, in the middle of your forehead, and above your eyes. In healthy paranasal sinuses, mucus is able to drain out, and air is able to circulate through them by way of your nose. However, when your paranasal sinuses are inflamed, mucus and air can become trapped. This can allow bacteria and other germs to grow and cause infection. Sinusitis can develop quickly and last only a short time (acute) or continue over a long period (chronic). Sinusitis that lasts for more than 12 weeks is considered chronic. CAUSES Causes of sinusitis include:  Allergies.  Structural abnormalities, such as displacement of the cartilage that separates your nostrils (deviated septum), which can decrease the air flow through your nose and sinuses and affect sinus drainage.  Functional abnormalities, such as when the small hairs (cilia) that line your sinuses and help remove mucus do not work properly or are not present. SIGNS AND SYMPTOMS Symptoms  of acute and chronic sinusitis are the same. The primary symptoms are pain and pressure around the affected sinuses. Other symptoms include:  Upper toothache.  Earache.  Headache.  Bad breath.  Decreased sense of smell and taste.  A cough, which worsens when you are lying flat.  Fatigue.  Fever.  Thick drainage from your nose, which often is green and may contain pus (purulent).  Swelling and warmth over the affected sinuses. DIAGNOSIS Your health care provider will perform a physical exam. During your exam, your health care provider may perform any of the following to help determine if you have acute sinusitis or chronic sinusitis:  Look in your nose for signs of abnormal growths in your nostrils (nasal polyps).  Tap over the affected sinus to check for signs of infection.  View the inside of your sinuses using an imaging device that has a light attached (endoscope). If your health care provider suspects that you have chronic sinusitis, one or more of the following tests may be recommended:  Allergy tests.  Nasal culture. A sample of mucus is taken from your nose, sent to a lab, and screened for bacteria.  Nasal cytology. A sample of mucus is taken from your nose and examined by your health care provider to determine if your sinusitis is related to an allergy. TREATMENT Most cases of acute sinusitis are related to a viral infection and will resolve on their own within 10 days. Sometimes, medicines are prescribed to help relieve symptoms of both acute and chronic sinusitis. These may include pain medicines, decongestants, nasal steroid sprays, or saline sprays. However, for sinusitis related   to a bacterial infection, your health care provider will prescribe antibiotic medicines. These are medicines that will help kill the bacteria causing the infection. Rarely, sinusitis is caused by a fungal infection. In these cases, your health care provider will prescribe antifungal  medicine. For some cases of chronic sinusitis, surgery is needed. Generally, these are cases in which sinusitis recurs more than 3 times per year, despite other treatments. HOME CARE INSTRUCTIONS  Drink plenty of water. Water helps thin the mucus so your sinuses can drain more easily.  Use a humidifier.  Inhale steam 3-4 times a day (for example, sit in the bathroom with the shower running).  Apply a warm, moist washcloth to your face 3-4 times a day, or as directed by your health care provider.  Use saline nasal sprays to help moisten and clean your sinuses.  Take medicines only as directed by your health care provider.  If you were prescribed either an antibiotic or antifungal medicine, finish it all even if you start to feel better. SEEK IMMEDIATE MEDICAL CARE IF:  You have increasing pain or severe headaches.  You have nausea, vomiting, or drowsiness.  You have swelling around your face.  You have vision problems.  You have a stiff neck.  You have difficulty breathing.   This information is not intended to replace advice given to you by your health care provider. Make sure you discuss any questions you have with your health care provider.   Document Released: 07/31/2005 Document Revised: 08/21/2014 Document Reviewed: 08/15/2011 Elsevier Interactive Patient Education 2016 [employer]  

## 2016-05-20 NOTE — Progress Notes (Signed)
Subjective:    Patient ID: James Larsen, male    DOB: 05-12-1971, 45 y.o.   MRN: [id number] Chief Complaint  Patient presents with  . Cough    Onset 3 days/ productive  . Generalized Body Aches    Onset 3 days    HPI  Have a cough x 1 wk and progressively worse and productive of dark brown sputum.  Had a fever last night up to 101.2 and took ibuprofen with relief.  He did have an old prescription of z-pack he started yseterday and already feels it has helped significantly. Was able to finally get some relief after starting zpack.  Has a h/o asthma but outgrew it - no problems in > 20 yrs.  Past Medical History:  Diagnosis Date  . Asthma   . Hypertension   . Malaria   . Renal disorder    kidney stones   Current Outpatient Prescriptions on File Prior to Visit  Medication Sig Dispense Refill  . aspirin EC 325 MG tablet Take 325 mg by mouth daily.    Marland Kitchen losartan-hydrochlorothiazide (HYZAAR) 50-12.5 MG per tablet Take 1 tablet by mouth daily.     No current facility-administered medications on file prior to visit.    No Known Allergies   Review of Systems  Constitutional: Positive for chills, diaphoresis and fatigue. Negative for activity change, appetite change, fever and unexpected weight change.  HENT: Positive for congestion, postnasal drip, rhinorrhea and sore throat. Negative for ear pain, mouth sores and sinus pressure.   Respiratory: Positive for cough. Negative for shortness of breath.   Cardiovascular: Negative for chest pain.  Gastrointestinal: Negative for abdominal pain, constipation, diarrhea, nausea and vomiting.  Genitourinary: Negative for dysuria.  Musculoskeletal: Negative for arthralgias, myalgias, neck pain and neck stiffness.  Skin: Negative for rash.  Neurological: Positive for headaches. Negative for syncope.  Hematological: Negative for adenopathy.  Psychiatric/Behavioral: Positive for sleep disturbance.       Objective:   Physical Exam    Constitutional: He is oriented to person, place, and time. He appears well-developed and well-nourished. No distress.  HENT:  Head: Normocephalic and atraumatic.  Right Ear: Tympanic membrane, external ear and ear canal normal.  Left Ear: External ear and ear canal normal. Tympanic membrane is injected.  Nose: Rhinorrhea present. No mucosal edema.  Mouth/Throat: Uvula is midline and mucous membranes are normal. No oropharyngeal exudate, posterior oropharyngeal edema or posterior oropharyngeal erythema.  Eyes: Conjunctivae are normal. Right eye exhibits no discharge. Left eye exhibits no discharge. No scleral icterus.  Neck: Normal range of motion. Neck supple. No thyromegaly present.  Cardiovascular: Normal rate, regular rhythm, normal heart sounds and intact distal pulses.   Pulmonary/Chest: Effort normal and breath sounds normal. No respiratory distress.  Lymphadenopathy:       Head (right side): No submandibular adenopathy present.       Head (left side): No submandibular adenopathy present.    He has no cervical adenopathy.       Right: No supraclavicular adenopathy present.       Left: No supraclavicular adenopathy present.  Neurological: He is alert and oriented to person, place, and time.  Skin: Skin is warm and dry. He is not diaphoretic. No erythema.  Psychiatric: He has a normal mood and affect. His behavior is normal.   BP 120/82 (BP Location: Right Arm, Patient Position: Sitting, Cuff Size: Large)   Pulse 73   Temp 98 F (36.7 C) (Oral)   Resp 17  Ht 5\' 11"  (1.803 m)   Wt 212 lb (96.2 kg)   SpO2 98%   BMI 29.57 kg/m      Assessment & Plan:   1. Acute non-recurrent sinusitis, unspecified location   Pt had a zpack at home - today is day 2 of 5. He has had a fever and should not return to work until 48 hrs after it has resolved since pt works as Marine scientist at [employer] - note given.   Meds ordered this encounter  Medications  . ipratropium (ATROVENT) 0.03 % nasal spray     Sig: Place 2 sprays into the nose 4 (four) times daily.    Dispense:  30 mL    Refill:  1     Delman Cheadle, M.D.  [facility] [address] [place], [place] [address] [phone] phone [phone] fax  05/20/16 5:55 PM

## 2017-01-10 DIAGNOSIS — Z13220 Encounter for screening for lipoid disorders: Secondary | ICD-10-CM | POA: Diagnosis not present

## 2017-01-10 DIAGNOSIS — J302 Other seasonal allergic rhinitis: Secondary | ICD-10-CM | POA: Diagnosis not present

## 2017-01-10 DIAGNOSIS — G5610 Other lesions of median nerve, unspecified upper limb: Secondary | ICD-10-CM | POA: Diagnosis not present

## 2017-01-10 DIAGNOSIS — I10 Essential (primary) hypertension: Secondary | ICD-10-CM | POA: Diagnosis not present

## 2017-01-10 DIAGNOSIS — Z131 Encounter for screening for diabetes mellitus: Secondary | ICD-10-CM | POA: Diagnosis not present

## 2017-01-10 DIAGNOSIS — Z125 Encounter for screening for malignant neoplasm of prostate: Secondary | ICD-10-CM | POA: Diagnosis not present

## 2017-08-03 DIAGNOSIS — D1801 Hemangioma of skin and subcutaneous tissue: Secondary | ICD-10-CM | POA: Diagnosis not present

## 2017-08-03 DIAGNOSIS — D485 Neoplasm of uncertain behavior of skin: Secondary | ICD-10-CM | POA: Diagnosis not present

## 2018-11-05 DIAGNOSIS — J302 Other seasonal allergic rhinitis: Secondary | ICD-10-CM | POA: Diagnosis not present

## 2018-11-05 DIAGNOSIS — J4520 Mild intermittent asthma, uncomplicated: Secondary | ICD-10-CM | POA: Diagnosis not present

## 2018-11-05 DIAGNOSIS — Z131 Encounter for screening for diabetes mellitus: Secondary | ICD-10-CM | POA: Diagnosis not present

## 2018-11-05 DIAGNOSIS — I10 Essential (primary) hypertension: Secondary | ICD-10-CM | POA: Diagnosis not present

## 2018-11-05 DIAGNOSIS — Z125 Encounter for screening for malignant neoplasm of prostate: Secondary | ICD-10-CM | POA: Diagnosis not present

## 2018-11-05 DIAGNOSIS — Z13220 Encounter for screening for lipoid disorders: Secondary | ICD-10-CM | POA: Diagnosis not present

## 2018-11-05 DIAGNOSIS — Z0000 Encounter for general adult medical examination without abnormal findings: Secondary | ICD-10-CM | POA: Diagnosis not present

## 2019-05-08 DIAGNOSIS — G5610 Other lesions of median nerve, unspecified upper limb: Secondary | ICD-10-CM | POA: Diagnosis not present

## 2019-05-08 DIAGNOSIS — I10 Essential (primary) hypertension: Secondary | ICD-10-CM | POA: Diagnosis not present

## 2019-05-08 DIAGNOSIS — Z1211 Encounter for screening for malignant neoplasm of colon: Secondary | ICD-10-CM | POA: Diagnosis not present

## 2019-05-08 DIAGNOSIS — J4520 Mild intermittent asthma, uncomplicated: Secondary | ICD-10-CM | POA: Diagnosis not present

## 2019-08-15 DIAGNOSIS — Z8616 Personal history of COVID-19: Secondary | ICD-10-CM

## 2019-08-15 HISTORY — DX: Personal history of COVID-19: Z86.16

## 2019-10-18 DIAGNOSIS — Z9189 Other specified personal risk factors, not elsewhere classified: Secondary | ICD-10-CM | POA: Diagnosis not present

## 2019-10-18 DIAGNOSIS — Z20822 Contact with and (suspected) exposure to covid-19: Secondary | ICD-10-CM | POA: Diagnosis not present

## 2019-10-18 DIAGNOSIS — R6889 Other general symptoms and signs: Secondary | ICD-10-CM | POA: Diagnosis not present

## 2019-10-18 DIAGNOSIS — J069 Acute upper respiratory infection, unspecified: Secondary | ICD-10-CM | POA: Diagnosis not present

## 2019-11-04 DIAGNOSIS — J4520 Mild intermittent asthma, uncomplicated: Secondary | ICD-10-CM | POA: Diagnosis not present

## 2019-11-04 DIAGNOSIS — Z0000 Encounter for general adult medical examination without abnormal findings: Secondary | ICD-10-CM | POA: Diagnosis not present

## 2019-11-04 DIAGNOSIS — Z1211 Encounter for screening for malignant neoplasm of colon: Secondary | ICD-10-CM | POA: Diagnosis not present

## 2019-11-04 DIAGNOSIS — Z125 Encounter for screening for malignant neoplasm of prostate: Secondary | ICD-10-CM | POA: Diagnosis not present

## 2019-11-04 DIAGNOSIS — Z13220 Encounter for screening for lipoid disorders: Secondary | ICD-10-CM | POA: Diagnosis not present

## 2019-11-04 DIAGNOSIS — I10 Essential (primary) hypertension: Secondary | ICD-10-CM | POA: Diagnosis not present

## 2019-11-04 DIAGNOSIS — E559 Vitamin D deficiency, unspecified: Secondary | ICD-10-CM | POA: Diagnosis not present

## 2019-11-04 DIAGNOSIS — Z131 Encounter for screening for diabetes mellitus: Secondary | ICD-10-CM | POA: Diagnosis not present

## 2019-11-25 DIAGNOSIS — Z1211 Encounter for screening for malignant neoplasm of colon: Secondary | ICD-10-CM | POA: Diagnosis not present

## 2019-11-25 DIAGNOSIS — Z1212 Encounter for screening for malignant neoplasm of rectum: Secondary | ICD-10-CM | POA: Diagnosis not present

## 2019-11-29 ENCOUNTER — Encounter (HOSPITAL_COMMUNITY): Payer: Self-pay

## 2019-11-29 ENCOUNTER — Other Ambulatory Visit: Payer: Self-pay

## 2019-11-29 ENCOUNTER — Emergency Department (HOSPITAL_COMMUNITY)
Admission: EM | Admit: 2019-11-29 | Discharge: 2019-11-29 | Disposition: A | Discharge status: Home / Self Care | Payer: BC Managed Care – PPO | Attending: Emergency Medicine | Admitting: Emergency Medicine

## 2019-11-29 ENCOUNTER — Emergency Department (HOSPITAL_COMMUNITY): Payer: BC Managed Care – PPO

## 2019-11-29 DIAGNOSIS — Z7982 Long term (current) use of aspirin: Secondary | ICD-10-CM | POA: Diagnosis not present

## 2019-11-29 DIAGNOSIS — N1330 Unspecified hydronephrosis: Secondary | ICD-10-CM | POA: Diagnosis not present

## 2019-11-29 DIAGNOSIS — Z79899 Other long term (current) drug therapy: Secondary | ICD-10-CM | POA: Diagnosis not present

## 2019-11-29 DIAGNOSIS — N201 Calculus of ureter: Secondary | ICD-10-CM | POA: Diagnosis not present

## 2019-11-29 DIAGNOSIS — R109 Unspecified abdominal pain: Secondary | ICD-10-CM | POA: Diagnosis not present

## 2019-11-29 DIAGNOSIS — J45909 Unspecified asthma, uncomplicated: Secondary | ICD-10-CM | POA: Diagnosis not present

## 2019-11-29 DIAGNOSIS — I10 Essential (primary) hypertension: Secondary | ICD-10-CM | POA: Diagnosis not present

## 2019-11-29 LAB — CBC WITH DIFFERENTIAL/PLATELET
Abs Immature Granulocytes: 0 10*3/uL (ref 0.00–0.07)
Basophils Absolute: 0 10*3/uL (ref 0.0–0.1)
Basophils Relative: 0 %
Eosinophils Absolute: 0.1 10*3/uL (ref 0.0–0.5)
Eosinophils Relative: 4 %
HCT: 47.1 % (ref 39.0–52.0)
Hemoglobin: 15.7 g/dL (ref 13.0–17.0)
Immature Granulocytes: 0 %
Lymphocytes Relative: 41 %
Lymphs Abs: 1.4 10*3/uL (ref 0.7–4.0)
MCH: 29.6 pg (ref 26.0–34.0)
MCHC: 33.3 g/dL (ref 30.0–36.0)
MCV: 88.9 fL (ref 80.0–100.0)
Monocytes Absolute: 0.4 10*3/uL (ref 0.1–1.0)
Monocytes Relative: 11 %
Neutro Abs: 1.4 10*3/uL — ABNORMAL LOW (ref 1.7–7.7)
Neutrophils Relative %: 44 %
Platelets: 195 10*3/uL (ref 150–400)
RBC: 5.3 MIL/uL (ref 4.22–5.81)
RDW: 12.1 % (ref 11.5–15.5)
WBC: 3.3 10*3/uL — ABNORMAL LOW (ref 4.0–10.5)
nRBC: 0 % (ref 0.0–0.2)

## 2019-11-29 LAB — URINALYSIS, ROUTINE W REFLEX MICROSCOPIC
Bilirubin Urine: NEGATIVE
Glucose, UA: NEGATIVE mg/dL
Hgb urine dipstick: NEGATIVE
Ketones, ur: NEGATIVE mg/dL
Leukocytes,Ua: NEGATIVE
Nitrite: NEGATIVE
Protein, ur: NEGATIVE mg/dL
Specific Gravity, Urine: 1.005 (ref 1.005–1.030)
pH: 6 (ref 5.0–8.0)

## 2019-11-29 LAB — BASIC METABOLIC PANEL
Anion gap: 7 (ref 5–15)
BUN: 16 mg/dL (ref 6–20)
CO2: 32 mmol/L (ref 22–32)
Calcium: 9.5 mg/dL (ref 8.9–10.3)
Chloride: 102 mmol/L (ref 98–111)
Creatinine, Ser: 1.17 mg/dL (ref 0.61–1.24)
GFR calc Af Amer: >60 mL/min (ref >60)
GFR calc non Af Amer: >60 mL/min (ref >60)
Glucose, Bld: 76 mg/dL (ref 70–99)
Potassium: 3.4 mmol/L — ABNORMAL LOW (ref 3.5–5.1)
Sodium: 141 mmol/L (ref 135–145)

## 2019-11-29 MED ORDER — MORPHINE SULFATE (PF) 4 MG/ML IV SOLN
4.0000 mg | Freq: Once | INTRAVENOUS | Status: AC
  Administered 2019-11-29: 4 mg via INTRAVENOUS
  Filled 2019-11-29: qty 1

## 2019-11-29 MED ORDER — KETOROLAC TROMETHAMINE 30 MG/ML IJ SOLN
30.0000 mg | Freq: Once | INTRAMUSCULAR | Status: DC
  Filled 2019-11-29: qty 1

## 2019-11-29 MED ORDER — TAMSULOSIN HCL 0.4 MG PO CAPS: 0.4000 mg | ORAL_CAPSULE | Freq: Every day | ORAL | 0 refills | Status: DC

## 2019-11-29 MED ORDER — SODIUM CHLORIDE 0.9 % IV BOLUS
1000.0000 mL | Freq: Once | INTRAVENOUS | Status: AC
  Administered 2019-11-29: 1000 mL via INTRAVENOUS

## 2019-11-29 MED ORDER — ONDANSETRON 4 MG PO TBDP: 4.0000 mg | ORAL_TABLET | Freq: Three times a day (TID) | ORAL | 0 refills | Status: DC | PRN

## 2019-11-29 NOTE — ED Provider Notes (Signed)
[facility] DEPT Provider Note   CSN: [id number] Arrival date & time: 11/29/19  1042     History Chief Complaint  Patient presents with  . Flank Pain    James Larsen is a 49 y.o. male with a past medical history of hypertension, asthma, PE diagnosed in 2016 and treated for same, not currently on anticoagulation, history of kidney stones presenting to the ED with a chief complaint of right flank pain.  For the past 7 hours started having aching pain in his right flank and back area.  3 hours ago the pain became constant and felt similar to his prior kidney stones.  He took 1 dose of Advil approximately 2 hours ago with some improvement in his symptoms.  States in the past his kidney stones have passed without difficulty.  He denies any chest pain, shortness of breath, dysuria, fever, vomiting, diarrhea, injuries or falls.  HPI     Past Medical History:  Diagnosis Date  . Asthma   . Hypertension   . Malaria   . Renal disorder    kidney stones    Patient Active Problem List   Diagnosis Date Noted  . Leukopenia   . Malaria-[place]ana 2016 11/29/2014  . Pulmonary embolus ([place]) 11/29/2014  . Acute pulmonary embolism ([place]) 11/29/2014  . Chest pain, unspecified 04/25/2014  . Chest pain 04/25/2014  . Hypertension 04/25/2014  . Asthma 04/25/2014    History reviewed. No pertinent surgical history.     Family History  Problem Relation Age of Onset  . Hypertension Mother     Social History   Tobacco Use  . Smoking status: Never Smoker  . Smokeless tobacco: Never Used  Substance Use Topics  . Alcohol use: No  . Drug use: No    Home Medications Prior to Admission medications   Medication Sig Start Date End Date Taking? Authorizing Provider  aspirin EC 325 MG tablet Take 325 mg by mouth daily.    [provider]  ipratropium (ATROVENT) 0.03 % nasal spray Place 2 sprays into the nose 4 (four) times daily. 05/20/16   Shawnee Knapp, MD    losartan-hydrochlorothiazide (HYZAAR) 50-12.5 MG per tablet Take 1 tablet by mouth daily.    [provider]  ondansetron (ZOFRAN ODT) 4 MG disintegrating tablet Take 1 tablet (4 mg total) by mouth every 8 (eight) hours as needed for nausea or vomiting. 11/29/19   Raesha Coonrod, PA-C  tamsulosin (FLOMAX) 0.4 MG CAPS capsule Take 1 capsule (0.4 mg total) by mouth daily after supper. 11/29/19   Delia Heady, PA-C    Allergies    Patient has no known allergies.  Review of Systems   Review of Systems  Constitutional: Negative for appetite change, chills and fever.  HENT: Negative for ear pain, rhinorrhea, sneezing and sore throat.   Eyes: Negative for photophobia and visual disturbance.  Respiratory: Negative for cough, chest tightness, shortness of breath and wheezing.   Cardiovascular: Negative for chest pain and palpitations.  Gastrointestinal: Negative for abdominal pain, blood in stool, constipation, diarrhea, nausea and vomiting.  Genitourinary: Positive for flank pain. Negative for dysuria, hematuria and urgency.  Musculoskeletal: Negative for myalgias.  Skin: Negative for rash.  Neurological: Negative for dizziness, weakness and light-headedness.    Physical Exam Updated Vital Signs BP (!) 132/95   Pulse (!) 56   Temp 97.9 F (36.6 C) (Oral)   Resp 15   Ht 5\' 11"  (1.803 m)   Wt 81.6 kg  SpO2 99%   BMI 25.10 kg/m   Physical Exam Vitals and nursing note reviewed.  Constitutional:      General: He is not in acute distress.    Appearance: He is well-developed.  HENT:     Head: Normocephalic and atraumatic.     Nose: Nose normal.  Eyes:     General: No scleral icterus.       Left eye: No discharge.     Conjunctiva/sclera: Conjunctivae normal.  Cardiovascular:     Rate and Rhythm: Normal rate and regular rhythm.     Heart sounds: Normal heart sounds. No murmur. No friction rub. No gallop.   Pulmonary:     Effort: Pulmonary effort is normal. No respiratory  distress.     Breath sounds: Normal breath sounds.  Abdominal:     General: Bowel sounds are normal. There is no distension.     Palpations: Abdomen is soft.     Tenderness: There is no abdominal tenderness. There is no guarding.  Musculoskeletal:        General: Normal range of motion.     Cervical back: Normal range of motion and neck supple.  Skin:    General: Skin is warm and dry.     Findings: No rash.  Neurological:     Mental Status: He is alert.     Motor: No abnormal muscle tone.     Coordination: Coordination normal.     ED Results / Procedures / Treatments   Labs (all labs ordered are listed, but only abnormal results are displayed) Labs Reviewed  URINALYSIS, ROUTINE W REFLEX MICROSCOPIC - Abnormal; Notable for the following components:      Result Value   Color, Urine STRAW (*)    All other components within normal limits  BASIC METABOLIC PANEL - Abnormal; Notable for the following components:   Potassium 3.4 (*)    All other components within normal limits  CBC WITH DIFFERENTIAL/PLATELET - Abnormal; Notable for the following components:   WBC 3.3 (*)    Neutro Abs 1.4 (*)    All other components within normal limits  URINE CULTURE    EKG None  Radiology CT Renal Stone Study  Result Date: 11/29/2019 CLINICAL DATA:  Acute right flank pain. EXAM: CT ABDOMEN AND PELVIS WITHOUT CONTRAST TECHNIQUE: Multidetector CT imaging of the abdomen and pelvis was performed following the standard protocol without IV contrast. COMPARISON:  June 18, 2014. FINDINGS: Lower chest: No acute abnormality. Hepatobiliary: No focal liver abnormality is seen. No gallstones, gallbladder wall thickening, or biliary dilatation. Pancreas: Unremarkable. No pancreatic ductal dilatation or surrounding inflammatory changes. Spleen: Normal in size without focal abnormality. Adrenals/Urinary Tract: Adrenal glands appear normal. Right renal cysts are noted. Mild bilateral hydroureteronephrosis is  noted. No definite ureteral calculus is noted on the left. Possible 1-2 mm calculus is noted in the distal right ureter. Urinary bladder is unremarkable. Stomach/Bowel: Stomach is within normal limits. Appendix appears normal. No evidence of bowel wall thickening, distention, or inflammatory changes. Vascular/Lymphatic: No significant vascular findings are present. No enlarged abdominal or pelvic lymph nodes. Reproductive: Prostate is unremarkable. Other: Small fat containing periumbilical hernia is noted. No ascites is noted. Musculoskeletal: No acute or significant osseous findings. IMPRESSION: 1. Mild bilateral hydroureteronephrosis is noted. No definite ureteral calculus is noted on the left. Possible 1-2 mm calculus is noted in the distal right ureter. 2. Small fat containing periumbilical hernia. Electronically Signed   By: Marijo Conception M.D.   On: 11/29/2019  13:42    Procedures Procedures (including critical care time)  Medications Ordered in ED Medications  ketorolac (TORADOL) 30 MG/ML injection 30 mg (has no administration in time range)  morphine 4 MG/ML injection 4 mg (4 mg Intravenous Given 11/29/19 1207)  sodium chloride 0.9 % bolus 1,000 mL (1,000 mLs Intravenous New Bag/Given 11/29/19 1205)    ED Course  I have reviewed the triage vital signs and the nursing notes.  Pertinent labs & imaging results that were available during my care of the patient were reviewed by me and considered in my medical decision making (see chart for details).    MDM Rules/Calculators/A&P                      49 year old male with past medical history of hypertension, asthma, PE diagnosed in 2016 with completed course of anticoagulation, not currently on anticoagulation, history of kidney stones presenting to the ED with a chief complaint of flank pain.  Reports aching pain that has now progressed to constant sharp pain in his right back and flank area.  States that this feels similar to prior kidney  stones which have passed without difficulty.  He has had improvement with 1 dose of Advil.  Denies chest pain, shortness of breath, fever, dysuria or vomiting.  On exam patient has tenderness palpation of the right flank area.  No abdominal tenderness to palpation.  He is afebrile.  CBC, BMP and urinalysis unremarkable, no signs of infection or hematuria.  CT renal stone study shows bilateral hydroureteronephrosis and possible 1 to 2 mm calculus in the distal right ureter.  Suspect this could be the cause of his symptoms as his pain is on the right side.  Patient symptoms controlled here with Toradol and morphine.  Will give short course of antiemetics and Flomax as he states that he does not do well with opioid pain medications.  He is comfortable with this plan, will have him follow-up with his urologist.  All imaging, if done today, including plain films, CT scans, and ultrasounds, independently reviewed by me, and interpretations confirmed via formal radiology reads.  Patient is hemodynamically stable, in NAD, and able to ambulate in the ED. Evaluation does not show pathology that would require ongoing emergent intervention or inpatient treatment. I explained the diagnosis to the patient. Pain has been managed and has no complaints prior to discharge. Patient is comfortable with above plan and is stable for discharge at this time. All questions were answered prior to disposition. Strict return precautions for returning to the ED were discussed. Encouraged follow up with PCP.   An After Visit Summary was printed and given to the patient.   Portions of this note were generated with Lobbyist. Dictation errors may occur despite best attempts at proofreading.  Final Clinical Impression(s) / ED Diagnoses Final diagnoses:  Ureterolithiasis    Rx / DC Orders ED Discharge Orders         Ordered    tamsulosin (FLOMAX) 0.4 MG CAPS capsule  Daily after supper     11/29/19 1401     ondansetron (ZOFRAN ODT) 4 MG disintegrating tablet  Every 8 hours PRN     11/29/19 1401           Delia Heady, PA-C 11/29/19 1403    Virgel Manifold, MD 12/03/19 [phone]

## 2019-11-29 NOTE — Discharge Instructions (Signed)
Take medications to help with your symptoms. Follow-up with your urologist if your symptoms do not improve in 4 to 5 days. Return to the ER if you start to develop worsening pain, fever, chest pain or shortness of breath.

## 2019-11-29 NOTE — ED Triage Notes (Signed)
Patient c/o right flank since 0500 today. patient states he has a known kidney stone.

## 2019-11-29 NOTE — ED Notes (Signed)
Pt ambulatory from triage 

## 2019-12-01 LAB — URINE CULTURE: Culture: 80000 — AB

## 2019-12-02 ENCOUNTER — Telehealth: Payer: Self-pay | Admitting: Emergency Medicine

## 2019-12-02 NOTE — Telephone Encounter (Signed)
Post ED Visit - Positive Culture Follow-up  Culture report reviewed by antimicrobial stewardship pharmacist: [facility] Team []  Elenor Quinones, Pharm.D. []  Heide Guile, Pharm.D., BCPS AQ-ID []  Parks Neptune, Pharm.D., BCPS []  Alycia Rossetti, Pharm.D., BCPS []  [place], Pharm.D., BCPS, AAHIVP []  Legrand Como, Pharm.D., BCPS, AAHIVP []  Salome Arnt, PharmD, BCPS []  Johnnette Gourd, PharmD, BCPS []  Hughes Better, PharmD, BCPS []  Leeroy Cha, PharmD []  Laqueta Linden, PharmD, BCPS []  Albertina Parr, PharmD  [facility] Team []  Leodis Sias, PharmD []  Lindell Spar, PharmD []  Royetta Asal, PharmD []  Graylin Shiver, Rph []  Rema Fendt) Glennon Mac, PharmD []  Arlyn Dunning, PharmD []  Netta Cedars, PharmD []  Dia Sitter, PharmD []  Leone Haven, PharmD []  Gretta Arab, PharmD []  Theodis Shove, PharmD []  Peggyann Juba, PharmD [x]  Reuel Boom, PharmD   Positive urine culture Treated with none, likely contaminant, no further patient follow-up is required at this time.  Hazle Nordmann 12/02/2019, 11:57 AM

## 2020-01-06 DIAGNOSIS — H11003 Unspecified pterygium of eye, bilateral: Secondary | ICD-10-CM | POA: Diagnosis not present

## 2020-01-06 DIAGNOSIS — H35363 Drusen (degenerative) of macula, bilateral: Secondary | ICD-10-CM | POA: Diagnosis not present

## 2020-01-26 DIAGNOSIS — Z418 Encounter for other procedures for purposes other than remedying health state: Secondary | ICD-10-CM | POA: Diagnosis not present

## 2020-01-26 DIAGNOSIS — I10 Essential (primary) hypertension: Secondary | ICD-10-CM | POA: Diagnosis not present

## 2020-04-22 ENCOUNTER — Encounter (HOSPITAL_COMMUNITY): Payer: Self-pay

## 2020-04-22 ENCOUNTER — Emergency Department (HOSPITAL_COMMUNITY): Payer: BC Managed Care – PPO

## 2020-04-22 ENCOUNTER — Other Ambulatory Visit: Payer: Self-pay

## 2020-04-22 ENCOUNTER — Emergency Department (HOSPITAL_COMMUNITY)
Admission: EM | Admit: 2020-04-22 | Discharge: 2020-04-22 | Disposition: A | Discharge status: Home / Self Care | Payer: BC Managed Care – PPO | Attending: Emergency Medicine | Admitting: Emergency Medicine

## 2020-04-22 ENCOUNTER — Emergency Department (HOSPITAL_COMMUNITY)
Admission: EM | Admit: 2020-04-22 | Discharge: 2020-04-22 | Disposition: A | Discharge status: Home / Self Care | Payer: BC Managed Care – PPO | Source: Home / Self Care

## 2020-04-22 ENCOUNTER — Encounter (HOSPITAL_COMMUNITY): Payer: Self-pay | Admitting: Emergency Medicine

## 2020-04-22 DIAGNOSIS — Z5321 Procedure and treatment not carried out due to patient leaving prior to being seen by health care provider: Secondary | ICD-10-CM | POA: Insufficient documentation

## 2020-04-22 DIAGNOSIS — M47816 Spondylosis without myelopathy or radiculopathy, lumbar region: Secondary | ICD-10-CM | POA: Diagnosis not present

## 2020-04-22 DIAGNOSIS — N200 Calculus of kidney: Secondary | ICD-10-CM | POA: Diagnosis not present

## 2020-04-22 DIAGNOSIS — I10 Essential (primary) hypertension: Secondary | ICD-10-CM | POA: Diagnosis not present

## 2020-04-22 DIAGNOSIS — N134 Hydroureter: Secondary | ICD-10-CM | POA: Diagnosis not present

## 2020-04-22 DIAGNOSIS — R109 Unspecified abdominal pain: Secondary | ICD-10-CM | POA: Insufficient documentation

## 2020-04-22 DIAGNOSIS — N132 Hydronephrosis with renal and ureteral calculous obstruction: Secondary | ICD-10-CM | POA: Diagnosis not present

## 2020-04-22 LAB — URINALYSIS, ROUTINE W REFLEX MICROSCOPIC
Bacteria, UA: NONE SEEN
Bilirubin Urine: NEGATIVE
Glucose, UA: NEGATIVE mg/dL
Ketones, ur: NEGATIVE mg/dL
Leukocytes,Ua: NEGATIVE
Nitrite: NEGATIVE
Protein, ur: NEGATIVE mg/dL
Specific Gravity, Urine: 1.015 (ref 1.005–1.030)
pH: 6 (ref 5.0–8.0)

## 2020-04-22 NOTE — ED Triage Notes (Addendum)
C/o R flank pain since 3am.  Reports history of kidney stones.  Denies urinary complaints.  Denies fever/chills.  Seen at [facility] this morning and [facility].  See lab results and CT from [facility].

## 2020-04-22 NOTE — ED Triage Notes (Signed)
Patient arrived with complaints of right sided flank pain over the last three hours. States he has a history of kidney stones.

## 2020-04-22 NOTE — ED Notes (Signed)
Pt stated that he was going to try urgent care and he may come back later.

## 2020-04-22 NOTE — ED Notes (Signed)
Called for pt x3, no response. 

## 2020-04-30 DIAGNOSIS — I10 Essential (primary) hypertension: Secondary | ICD-10-CM | POA: Diagnosis not present

## 2020-04-30 DIAGNOSIS — N200 Calculus of kidney: Secondary | ICD-10-CM | POA: Diagnosis not present

## 2020-06-02 DIAGNOSIS — Z87442 Personal history of urinary calculi: Secondary | ICD-10-CM | POA: Diagnosis not present

## 2020-06-02 DIAGNOSIS — N281 Cyst of kidney, acquired: Secondary | ICD-10-CM | POA: Diagnosis not present

## 2020-06-04 DIAGNOSIS — J4520 Mild intermittent asthma, uncomplicated: Secondary | ICD-10-CM | POA: Diagnosis not present

## 2020-06-04 DIAGNOSIS — N200 Calculus of kidney: Secondary | ICD-10-CM | POA: Diagnosis not present

## 2020-06-04 DIAGNOSIS — J302 Other seasonal allergic rhinitis: Secondary | ICD-10-CM | POA: Diagnosis not present

## 2020-06-04 DIAGNOSIS — I10 Essential (primary) hypertension: Secondary | ICD-10-CM | POA: Diagnosis not present

## 2020-06-09 DIAGNOSIS — N281 Cyst of kidney, acquired: Secondary | ICD-10-CM | POA: Diagnosis not present

## 2020-06-28 DIAGNOSIS — N200 Calculus of kidney: Secondary | ICD-10-CM | POA: Diagnosis not present

## 2020-09-14 DIAGNOSIS — Z87442 Personal history of urinary calculi: Secondary | ICD-10-CM | POA: Diagnosis not present

## 2020-09-14 DIAGNOSIS — N281 Cyst of kidney, acquired: Secondary | ICD-10-CM | POA: Diagnosis not present

## 2020-10-04 DIAGNOSIS — N200 Calculus of kidney: Secondary | ICD-10-CM | POA: Diagnosis not present

## 2020-10-04 DIAGNOSIS — J4520 Mild intermittent asthma, uncomplicated: Secondary | ICD-10-CM | POA: Diagnosis not present

## 2020-10-04 DIAGNOSIS — I10 Essential (primary) hypertension: Secondary | ICD-10-CM | POA: Diagnosis not present

## 2020-11-01 DIAGNOSIS — E559 Vitamin D deficiency, unspecified: Secondary | ICD-10-CM | POA: Diagnosis not present

## 2020-11-01 DIAGNOSIS — Z125 Encounter for screening for malignant neoplasm of prostate: Secondary | ICD-10-CM | POA: Diagnosis not present

## 2020-11-01 DIAGNOSIS — Z0000 Encounter for general adult medical examination without abnormal findings: Secondary | ICD-10-CM | POA: Diagnosis not present

## 2020-11-01 DIAGNOSIS — I10 Essential (primary) hypertension: Secondary | ICD-10-CM | POA: Diagnosis not present

## 2020-11-01 DIAGNOSIS — Z13220 Encounter for screening for lipoid disorders: Secondary | ICD-10-CM | POA: Diagnosis not present

## 2020-11-01 DIAGNOSIS — Z131 Encounter for screening for diabetes mellitus: Secondary | ICD-10-CM | POA: Diagnosis not present

## 2020-11-01 DIAGNOSIS — J4520 Mild intermittent asthma, uncomplicated: Secondary | ICD-10-CM | POA: Diagnosis not present

## 2021-01-07 DIAGNOSIS — N3001 Acute cystitis with hematuria: Secondary | ICD-10-CM | POA: Diagnosis not present

## 2021-01-08 DIAGNOSIS — N3001 Acute cystitis with hematuria: Secondary | ICD-10-CM | POA: Diagnosis not present

## 2021-04-08 DIAGNOSIS — H35341 Macular cyst, hole, or pseudohole, right eye: Secondary | ICD-10-CM | POA: Diagnosis not present

## 2021-04-08 DIAGNOSIS — H43821 Vitreomacular adhesion, right eye: Secondary | ICD-10-CM | POA: Diagnosis not present

## 2021-04-11 ENCOUNTER — Encounter (INDEPENDENT_AMBULATORY_CARE_PROVIDER_SITE_OTHER): Payer: BC Managed Care – PPO | Admitting: Ophthalmology

## 2021-04-11 ENCOUNTER — Other Ambulatory Visit: Payer: Self-pay

## 2021-04-12 ENCOUNTER — Encounter (INDEPENDENT_AMBULATORY_CARE_PROVIDER_SITE_OTHER): Payer: BC Managed Care – PPO | Admitting: Ophthalmology

## 2021-04-12 DIAGNOSIS — H353132 Nonexudative age-related macular degeneration, bilateral, intermediate dry stage: Secondary | ICD-10-CM

## 2021-04-12 DIAGNOSIS — H35033 Hypertensive retinopathy, bilateral: Secondary | ICD-10-CM

## 2021-04-12 DIAGNOSIS — I10 Essential (primary) hypertension: Secondary | ICD-10-CM | POA: Diagnosis not present

## 2021-04-12 DIAGNOSIS — H35341 Macular cyst, hole, or pseudohole, right eye: Secondary | ICD-10-CM

## 2021-04-12 DIAGNOSIS — H43813 Vitreous degeneration, bilateral: Secondary | ICD-10-CM

## 2021-06-02 DIAGNOSIS — H11043 Peripheral pterygium, stationary, bilateral: Secondary | ICD-10-CM | POA: Diagnosis not present

## 2021-06-02 DIAGNOSIS — H35363 Drusen (degenerative) of macula, bilateral: Secondary | ICD-10-CM | POA: Diagnosis not present

## 2021-06-02 DIAGNOSIS — H2513 Age-related nuclear cataract, bilateral: Secondary | ICD-10-CM | POA: Diagnosis not present

## 2021-06-02 DIAGNOSIS — H43821 Vitreomacular adhesion, right eye: Secondary | ICD-10-CM | POA: Diagnosis not present

## 2021-06-07 ENCOUNTER — Encounter (INDEPENDENT_AMBULATORY_CARE_PROVIDER_SITE_OTHER): Payer: BC Managed Care – PPO | Admitting: Ophthalmology

## 2021-06-07 ENCOUNTER — Other Ambulatory Visit: Payer: Self-pay

## 2021-06-28 ENCOUNTER — Encounter (INDEPENDENT_AMBULATORY_CARE_PROVIDER_SITE_OTHER): Payer: BC Managed Care – PPO | Admitting: Ophthalmology

## 2021-07-29 ENCOUNTER — Other Ambulatory Visit: Payer: Self-pay | Admitting: Internal Medicine

## 2021-07-29 DIAGNOSIS — N401 Enlarged prostate with lower urinary tract symptoms: Secondary | ICD-10-CM | POA: Diagnosis not present

## 2021-07-29 DIAGNOSIS — I10 Essential (primary) hypertension: Secondary | ICD-10-CM | POA: Diagnosis not present

## 2021-07-29 DIAGNOSIS — I2699 Other pulmonary embolism without acute cor pulmonale: Secondary | ICD-10-CM | POA: Diagnosis not present

## 2021-08-06 LAB — VENOUS THROMBOSIS HYPERCOAGULABILITY PANEL WITH REFLEX: PROTHROMBIN (FACTOR II): NEGATIVE

## 2021-08-10 LAB — VENOUS THROMBOSIS HYPERCOAGULABILITY PANEL WITH REFLEX
Anticardiolipin IgG: <2 GPL-U/mL (ref <20.0)
Anticardiolipin IgM: <2 MPL-U/mL (ref <20.0)
Beta-2 Glyco 1 IgM: <2 U/mL (ref <20.0)
Beta-2 Glyco I IgG: <2 U/mL (ref <20.0)

## 2021-08-18 DIAGNOSIS — R3915 Urgency of urination: Secondary | ICD-10-CM | POA: Diagnosis not present

## 2021-08-18 DIAGNOSIS — N281 Cyst of kidney, acquired: Secondary | ICD-10-CM | POA: Diagnosis not present

## 2021-08-18 DIAGNOSIS — N401 Enlarged prostate with lower urinary tract symptoms: Secondary | ICD-10-CM | POA: Diagnosis not present

## 2021-08-18 DIAGNOSIS — R350 Frequency of micturition: Secondary | ICD-10-CM | POA: Diagnosis not present

## 2021-09-13 DIAGNOSIS — N3281 Overactive bladder: Secondary | ICD-10-CM | POA: Diagnosis not present

## 2021-09-13 DIAGNOSIS — I2699 Other pulmonary embolism without acute cor pulmonale: Secondary | ICD-10-CM | POA: Diagnosis not present

## 2021-09-13 DIAGNOSIS — I10 Essential (primary) hypertension: Secondary | ICD-10-CM | POA: Diagnosis not present

## 2021-09-27 DIAGNOSIS — R3982 Chronic bladder pain: Secondary | ICD-10-CM | POA: Diagnosis not present

## 2021-09-27 DIAGNOSIS — R350 Frequency of micturition: Secondary | ICD-10-CM | POA: Diagnosis not present

## 2021-09-27 DIAGNOSIS — Z87442 Personal history of urinary calculi: Secondary | ICD-10-CM | POA: Diagnosis not present

## 2021-09-27 DIAGNOSIS — N401 Enlarged prostate with lower urinary tract symptoms: Secondary | ICD-10-CM | POA: Diagnosis not present

## 2021-11-04 ENCOUNTER — Other Ambulatory Visit: Payer: Self-pay | Admitting: Urology

## 2021-11-12 DIAGNOSIS — D090 Carcinoma in situ of bladder: Secondary | ICD-10-CM

## 2021-11-12 HISTORY — DX: Carcinoma in situ of bladder: D09.0

## 2021-11-14 ENCOUNTER — Encounter (HOSPITAL_BASED_OUTPATIENT_CLINIC_OR_DEPARTMENT_OTHER): Payer: Self-pay | Admitting: Urology

## 2021-11-17 ENCOUNTER — Encounter (HOSPITAL_BASED_OUTPATIENT_CLINIC_OR_DEPARTMENT_OTHER): Payer: Self-pay | Admitting: Urology

## 2021-11-17 ENCOUNTER — Other Ambulatory Visit: Payer: Self-pay

## 2021-11-17 NOTE — Progress Notes (Signed)
Spoke w/ via phone for pre-op interview--- pt ?Lab needs dos----  [facility] and ekg             ?Lab results------ no ?COVID test -----patient states asymptomatic no test needed ?Arrive at ------- 1130 on 11-21-2021 ?NPO after MN NO Solid Food.  Clear liquids from MN until--- 1030 ?Med rec completed ?Medications to take morning of surgery ----- none ?Diabetic medication ----- n/a ?Patient instructed no nail polish to be worn day of surgery ?Patient instructed to bring photo id and insurance card day of surgery ?Patient aware to have Driver (ride ) / caregiver  for 24 hours after surgery --wife, caroline ?Patient Special Instructions ----- n/a ?Pre-Op special Istructions ----- n/a ?Patient verbalized understanding of instructions that were given at this phone interview. ?Patient denies shortness of breath, chest pain, fever, cough at this phone interview.  ?

## 2021-11-21 ENCOUNTER — Ambulatory Visit (HOSPITAL_BASED_OUTPATIENT_CLINIC_OR_DEPARTMENT_OTHER)
Admission: RE | Admit: 2021-11-21 | Discharge: 2021-11-21 | Disposition: A | Discharge status: Home / Self Care | Payer: BC Managed Care – PPO | Attending: Urology | Admitting: Urology

## 2021-11-21 ENCOUNTER — Encounter (HOSPITAL_BASED_OUTPATIENT_CLINIC_OR_DEPARTMENT_OTHER): Payer: Self-pay | Admitting: Urology

## 2021-11-21 ENCOUNTER — Encounter (HOSPITAL_BASED_OUTPATIENT_CLINIC_OR_DEPARTMENT_OTHER)
Admission: RE | Disposition: A | Discharge status: Home / Self Care | Payer: Self-pay | Source: Home / Self Care | Attending: Urology

## 2021-11-21 ENCOUNTER — Other Ambulatory Visit: Payer: Self-pay

## 2021-11-21 ENCOUNTER — Ambulatory Visit (HOSPITAL_BASED_OUTPATIENT_CLINIC_OR_DEPARTMENT_OTHER): Payer: BC Managed Care – PPO | Admitting: Anesthesiology

## 2021-11-21 DIAGNOSIS — I82409 Acute embolism and thrombosis of unspecified deep veins of unspecified lower extremity: Secondary | ICD-10-CM | POA: Diagnosis not present

## 2021-11-21 DIAGNOSIS — J45909 Unspecified asthma, uncomplicated: Secondary | ICD-10-CM | POA: Insufficient documentation

## 2021-11-21 DIAGNOSIS — D090 Carcinoma in situ of bladder: Secondary | ICD-10-CM | POA: Diagnosis not present

## 2021-11-21 DIAGNOSIS — Z79899 Other long term (current) drug therapy: Secondary | ICD-10-CM | POA: Diagnosis not present

## 2021-11-21 DIAGNOSIS — I10 Essential (primary) hypertension: Secondary | ICD-10-CM | POA: Diagnosis not present

## 2021-11-21 DIAGNOSIS — N401 Enlarged prostate with lower urinary tract symptoms: Secondary | ICD-10-CM | POA: Diagnosis not present

## 2021-11-21 DIAGNOSIS — Z86711 Personal history of pulmonary embolism: Secondary | ICD-10-CM | POA: Diagnosis not present

## 2021-11-21 DIAGNOSIS — N329 Bladder disorder, unspecified: Secondary | ICD-10-CM | POA: Diagnosis not present

## 2021-11-21 DIAGNOSIS — N3010 Interstitial cystitis (chronic) without hematuria: Secondary | ICD-10-CM | POA: Insufficient documentation

## 2021-11-21 DIAGNOSIS — R350 Frequency of micturition: Secondary | ICD-10-CM | POA: Insufficient documentation

## 2021-11-21 HISTORY — DX: Dysuria: R30.0

## 2021-11-21 HISTORY — DX: Personal history of urinary calculi: Z87.442

## 2021-11-21 HISTORY — DX: Bladder disorder, unspecified: N32.9

## 2021-11-21 HISTORY — DX: Vitreomacular adhesion, right eye: H43.821

## 2021-11-21 HISTORY — DX: Nocturia: R35.1

## 2021-11-21 HISTORY — DX: Urgency of urination: R39.15

## 2021-11-21 HISTORY — DX: Presence of spectacles and contact lenses: Z97.3

## 2021-11-21 HISTORY — PX: CYSTOSCOPY WITH BIOPSY: SHX5122

## 2021-11-21 LAB — POCT I-STAT, CHEM 8
BUN: 14 mg/dL (ref 6–20)
Calcium, Ion: 1.28 mmol/L (ref 1.15–1.40)
Chloride: 102 mmol/L (ref 98–111)
Creatinine, Ser: 1.1 mg/dL (ref 0.61–1.24)
Glucose, Bld: 88 mg/dL (ref 70–99)
HCT: 47 % (ref 39.0–52.0)
Hemoglobin: 16 g/dL (ref 13.0–17.0)
Potassium: 3.2 mmol/L — ABNORMAL LOW (ref 3.5–5.1)
Sodium: 143 mmol/L (ref 135–145)
TCO2: 27 mmol/L (ref 22–32)

## 2021-11-21 SURGERY — CYSTOSCOPY, WITH BIOPSY
Anesthesia: General | Site: Bladder

## 2021-11-21 MED ORDER — DEXAMETHASONE SODIUM PHOSPHATE 10 MG/ML IJ SOLN
INTRAMUSCULAR | Status: AC
  Filled 2021-11-21: qty 1

## 2021-11-21 MED ORDER — FENTANYL CITRATE (PF) 100 MCG/2ML IJ SOLN
INTRAMUSCULAR | Status: AC
  Filled 2021-11-21: qty 2

## 2021-11-21 MED ORDER — CEFAZOLIN SODIUM-DEXTROSE 2-4 GM/100ML-% IV SOLN
2.0000 g | Freq: Once | INTRAVENOUS | Status: AC
  Administered 2021-11-21: 2 g via INTRAVENOUS

## 2021-11-21 MED ORDER — PROPOFOL 10 MG/ML IV BOLUS
INTRAVENOUS | Status: DC | PRN
  Administered 2021-11-21: 200 mg via INTRAVENOUS

## 2021-11-21 MED ORDER — CEFAZOLIN SODIUM-DEXTROSE 2-4 GM/100ML-% IV SOLN
INTRAVENOUS | Status: AC
  Filled 2021-11-21: qty 100

## 2021-11-21 MED ORDER — LACTATED RINGERS IV SOLN: INTRAVENOUS | Status: DC

## 2021-11-21 MED ORDER — FENTANYL CITRATE (PF) 100 MCG/2ML IJ SOLN
INTRAMUSCULAR | Status: DC | PRN
  Administered 2021-11-21: 50 ug via INTRAVENOUS
  Administered 2021-11-21 (×2): 25 ug via INTRAVENOUS

## 2021-11-21 MED ORDER — ACETAMINOPHEN 500 MG PO TABS
ORAL_TABLET | ORAL | Status: AC
  Filled 2021-11-21: qty 2

## 2021-11-21 MED ORDER — PROPOFOL 10 MG/ML IV BOLUS
INTRAVENOUS | Status: AC
  Filled 2021-11-21: qty 20

## 2021-11-21 MED ORDER — LIDOCAINE HCL (CARDIAC) PF 100 MG/5ML IV SOSY
PREFILLED_SYRINGE | INTRAVENOUS | Status: DC | PRN
  Administered 2021-11-21: 100 mg via INTRAVENOUS

## 2021-11-21 MED ORDER — PROPOFOL 10 MG/ML IV BOLUS
INTRAVENOUS | Status: AC
Start: 2021-11-21 — End: ?
  Filled 2021-11-21: qty 20

## 2021-11-21 MED ORDER — MIDAZOLAM HCL 5 MG/5ML IJ SOLN
INTRAMUSCULAR | Status: DC | PRN
  Administered 2021-11-21: 2 mg via INTRAVENOUS

## 2021-11-21 MED ORDER — MIDAZOLAM HCL 2 MG/2ML IJ SOLN
INTRAMUSCULAR | Status: AC
  Filled 2021-11-21: qty 2

## 2021-11-21 MED ORDER — LIDOCAINE HCL (PF) 2 % IJ SOLN
INTRAMUSCULAR | Status: AC
  Filled 2021-11-21: qty 5

## 2021-11-21 MED ORDER — DEXAMETHASONE SODIUM PHOSPHATE 4 MG/ML IJ SOLN
INTRAMUSCULAR | Status: DC | PRN
  Administered 2021-11-21: 10 mg via INTRAVENOUS

## 2021-11-21 MED ORDER — ACETAMINOPHEN 500 MG PO TABS
1000.0000 mg | ORAL_TABLET | Freq: Once | ORAL | Status: AC
  Administered 2021-11-21: 1000 mg via ORAL

## 2021-11-21 MED ORDER — STERILE WATER FOR IRRIGATION IR SOLN
Status: DC | PRN
  Administered 2021-11-21: 3000 mL

## 2021-11-21 MED ORDER — ONDANSETRON HCL 4 MG/2ML IJ SOLN
INTRAMUSCULAR | Status: DC | PRN
  Administered 2021-11-21: 4 mg via INTRAVENOUS

## 2021-11-21 MED ORDER — FENTANYL CITRATE (PF) 100 MCG/2ML IJ SOLN: 25.0000 ug | INTRAMUSCULAR | Status: DC | PRN

## 2021-11-21 MED ORDER — ONDANSETRON HCL 4 MG/2ML IJ SOLN
INTRAMUSCULAR | Status: AC
  Filled 2021-11-21: qty 2

## 2021-11-21 MED ORDER — TRAMADOL HCL 50 MG PO TABS: 50.0000 mg | ORAL_TABLET | Freq: Four times a day (QID) | ORAL | 0 refills | Status: DC | PRN

## 2021-11-21 SURGICAL SUPPLY — 12 items
CATH FOLEY 2WAY SLVR  5CC 16FR (CATHETERS) ×2
CATH FOLEY 2WAY SLVR 5CC 16FR (CATHETERS) IMPLANT
CLOTH BEACON ORANGE TIMEOUT ST (SAFETY) ×2 IMPLANT
ELECT REM PT RETURN 9FT ADLT (ELECTROSURGICAL) ×2
ELECTRODE REM PT RTRN 9FT ADLT (ELECTROSURGICAL) ×1 IMPLANT
GLOVE BIO SURGEON STRL SZ7.5 (GLOVE) ×2 IMPLANT
GOWN STRL REUS W/TWL LRG LVL3 (GOWN DISPOSABLE) ×2 IMPLANT
MANIFOLD NEPTUNE II (INSTRUMENTS) ×1 IMPLANT
NS IRRIG 1000ML POUR BTL (IV SOLUTION) IMPLANT
PACK CYSTO (CUSTOM PROCEDURE TRAY) ×2 IMPLANT
WATER STERILE IRR 3000ML UROMA (IV SOLUTION) ×2 IMPLANT
WATER STERILE IRR 500ML POUR (IV SOLUTION) ×1 IMPLANT

## 2021-11-21 NOTE — Anesthesia Postprocedure Evaluation (Signed)
Anesthesia Post Note ? ?Patient: Timber Lucarelli ? ?Procedure(s) Performed: CYSTOSCOPY WITH BIOPSY/ FULGURATION/ LOW PRESSURE HYDRODISTENSION (Bladder) ? ?  ? ?Patient location during evaluation: PACU ?Anesthesia Type: General ?Level of consciousness: awake and alert ?Pain management: pain level controlled ?Vital Signs Assessment: post-procedure vital signs reviewed and stable ?Respiratory status: spontaneous breathing, nonlabored ventilation, respiratory function stable and patient connected to nasal cannula oxygen ?Cardiovascular status: blood pressure returned to baseline and stable ?Postop Assessment: no apparent nausea or vomiting ?Anesthetic complications: no ? ? ?No notable events documented. ? ?Last Vitals:  ?Vitals:  ? 11/21/21 1400 11/21/21 1415  ?BP: (!) 142/100 (!) 142/96  ?Pulse: 70 64  ?Resp: 16 14  ?Temp: (!) 36.3 ?C   ?SpO2: 99% 95%  ?  ?Last Pain:  ?Vitals:  ? 11/21/21 1400  ?TempSrc:   ?PainSc: 0-No pain  ? ? ?  ?  ?  ?  ?  ?  ? ?Godfrey Tritschler ? ? ? ? ?

## 2021-11-21 NOTE — Discharge Instructions (Addendum)
Activity:  You are encouraged to ambulate frequently (about every hour during waking hours) to help prevent blood clots from forming in your legs or lungs.   ? ?Diet: You should advance your diet as instructed by your physician.  It will be normal to have some bloating, nausea, and abdominal discomfort intermittently. ? ?Prescriptions:  You will be provided a prescription for pain medication to take as needed.  If your pain is not severe enough to require the prescription pain medication, you may take extra strength Tylenol instead which will have less side effects.  You should also take a prescribed stool softener to avoid straining with bowel movements as the prescription pain medication may constipate you. ? ?What to call us about: You should call the office [phone]) if you develop fever > 101 or develop persistent vomiting. Activity:  You are encouraged to ambulate frequently (about every hour during waking hours) to help prevent blood clots from forming in your legs or lungs. ?CYSTOSCOPY HOME CARE INSTRUCTIONS ? ?Activity: ?Rest for the remainder of the day.  Do not drive or operate equipment today.  You may resume normal activities in one to two days as instructed by your physician.  ? ?Meals: ?Drink plenty of liquids and eat light foods such as gelatin or soup this evening.  You may return to a normal meal plan tomorrow. ? ?Return to Work: ?You may return to work in one to two days or as instructed by your physician. ? ?Special Instructions / Symptoms: ?Call your physician if any of these symptoms occur: ? ? -persistent or heavy bleeding ? -bleeding which continues after first few urination ? -large blood clots that are difficult to pass ? -urine stream diminishes or stops completely ? -fever equal to or higher than 101 degrees Farenheit. ? -cloudy urine with a strong, foul odor ? -severe pain ? ?Females should always wipe from front to back after elimination.  You may feel some burning pain when you  urinate.  This should disappear with time.  Applying moist heat to the lower abdomen or a hot tub bath may help relieve the pain. \ ? ?Post Anesthesia Home Care Instructions ? ?Activity: ?Get plenty of rest for the remainder of the day. A responsible individual must stay with you for 24 hours following the procedure.  ?For the next 24 hours, DO NOT: ?-Drive a car ?-Paediatric nurse ?-Drink alcoholic beverages ?-Take any medication unless instructed by your physician ?-Make any legal decisions or sign important papers. ? ?Meals: ?Start with liquid foods such as gelatin or soup. Progress to regular foods as tolerated. Avoid greasy, spicy, heavy foods. If nausea and/or vomiting occur, drink only clear liquids until the nausea and/or vomiting subsides. Call your physician if vomiting continues. ? ?Special Instructions/Symptoms: ?Your throat may feel dry or sore from the anesthesia or the breathing tube placed in your throat during surgery. If this causes discomfort, gargle with warm salt water. The discomfort should disappear within 24 hours. ? ?No acetaminophen/Tylenol until after 6:00 pm today if needed. ? ?  ?Activity:  You are encouraged to ambulate frequently (about every hour during waking hours) to help prevent blood clots from forming in your legs or lungs.   ? ?Diet: You should advance your diet as instructed by your physician.  It will be normal to have some bloating, nausea, and abdominal discomfort intermittently. ? ?Prescriptions:  You will be provided a prescription for pain medication to take as needed.  If your pain is not severe  enough to require the prescription pain medication, you may take extra strength Tylenol instead which will have less side effects.  You should also take a prescribed stool softener to avoid straining with bowel movements as the prescription pain medication may constipate you. ? ?What to call us about: You should call the office [phone]) if you develop fever > 101 or  develop persistent vomiting. Activity:  You are encouraged to ambulate frequently (about every hour during waking hours) to help prevent blood clots from forming in your legs or lungs.   ? ?You have a foley catheter in place.  You may remove this Foley catheter in 3 days with provided 10 cc syringe. ?

## 2021-11-21 NOTE — Op Note (Signed)
Operative Note ? ?Preoperative diagnosis:  ?1.  Interstitial cystitis ? ?Postoperative diagnosis: ?1.  Interstitial cystitis ? ?Procedure(s): ?1.  Cystoscopy with low pressure hydrodistention  ?2. Bladder biopsy with fulguration ? ?Surgeon: Rexene Alberts, MD ? ?Assistants:  None ? ?Anesthesia:  General ? ?Complications:  None ? ?EBL:  Minimal ? ?Specimens: ?1. Bladder biopsy ?ID Type Source Tests Collected by Time Destination  ?1 : Bladder Biopsy Tissue PATH Benign [facility] PATHOLOGY Janith Lima, MD 11/21/2021 1309   ? ? ?Drains/Catheters: ?1.  16 Fr foley ? ?Intraoperative findings:   ?Cystourethroscopy demonstrated no evidence of urethral strictures.  He had a small and nonobstructing prostate.  He had a slight elevation of his bladder neck however his bladder neck was widely patent.  Initial cystoscopy demonstrated no evidence for any suspicious areas concerning for CIS or malignancy.  He did have several glomerulations including one region on his posterior lateral upper aspect of his bladder concerning for Hunner lesion. ?I performed low-pressure bladder hydrodistention with 60 cm H2O twice holding the pressure for about 3 minutes each.  Following this, there was minimal bleeding from these areas of glomerulations. ?Next, I performed a biopsy of the suspicious appearing Hunner lesion in the posterior lateral left side of his bladder.  There is no evidence of any perforation.  Excellent hemostasis was obtained. ?A 16 French Foley catheter was placed with return of clear yellow urine. ? ?Indication:  James Larsen is a 51 y.o. male with bothersome lower urinary tract symptoms suspicious for interstitial cystitis and a possible Hunner lesion.  All the risks, benefits were discussed with the patient to include but not limited to infection, pain, bleeding, damage to adjacent structures, need for further operations, adverse reaction to anesthesia and death.  Patient understands these risks and agrees to proceed  with the operation as planned.   ? ?Description of procedure: ?After informed consent was obtained from the patient, the patient was taken to the operating room. General anesthesia was administered. The patient was placed in dorsal lithotomy position and prepped and draped in usual sterile fashion. Sequential compression devices were applied to lower extremities at the beginning of the case for DVT prophylaxis. Antibiotics were infused prior to surgery start time. A surgical time-out was performed to properly identify the patient, the surgery to be performed, and the surgical site.   ?  ?We then passed the 21-French rigid cystoscope down the urethra and into the bladder under direct vision without any difficulty. The anterior urethral was normal. The prostate was non-obstructing. The bladder was inspected with 30 and 70 degree lenses. Once in the bladder, systematic evaluation of bladder revealed no areas concerning for CIS or malignancy however he did have a lesion on the posterior left lateral upper portion of his bladder that appeared to be a Hunner lesion. The ureteral orfices were in orthotopic position and not involved. ? ?Next, I performed bladder hydrodistention with 60 cm H2O and kept his bladder distended for 3 minutes on 2 consecutive attempts.  There is minimal bleeding of his granulations following this procedure.  Next, using the cold cup biopsy, I sampled the Hunner lesion.  Excellent hemostasis was obtained.  There is no evidence or concern for perforation. ?  ?A 16 French Foley catheter was placed to gravity drainage. ? ?Plan: Leave Foley catheter in place for 3 days.  He will follow-up with me next week to reassess his symptoms. ? ?Matt R. Caitlynn Ju MD ?[facility]  ?Pager: [phone] ? ?

## 2021-11-21 NOTE — Anesthesia Procedure Notes (Signed)
Procedure Name: LMA Insertion ?Date/Time: 11/21/2021 1:26 PM ?Performed by: Justice Rocher, CRNA ?Pre-anesthesia Checklist: Patient identified, Emergency Drugs available, Suction available, Patient being monitored and Timeout performed ?Patient Re-evaluated:Patient Re-evaluated prior to induction ?Oxygen Delivery Method: Circle system utilized ?Preoxygenation: Pre-oxygenation with 100% oxygen ?Induction Type: IV induction ?Ventilation: Mask ventilation without difficulty ?LMA: LMA inserted ?LMA Size: 5.0 ?Number of attempts: 1 ?Airway Equipment and Method: Bite block ?Placement Confirmation: positive ETCO2, breath sounds checked- equal and bilateral and CO2 detector ?Tube secured with: Tape ?Dental Injury: Teeth and Oropharynx as per pre-operative assessment  ? ? ? ? ?

## 2021-11-21 NOTE — Transfer of Care (Signed)
Immediate Anesthesia Transfer of Care Note ? ?Patient: James Larsen ? ?Procedure(s) Performed: Procedure(s) (LRB): ?CYSTOSCOPY WITH BIOPSY/ FULGURATION/ LOW PRESSURE HYDRODISTENSION (N/A) ? ?Patient Location: PACU ? ?Anesthesia Type: General ? ?Level of Consciousness: awake, sedated, patient cooperative and responds to stimulation ? ?Airway & Oxygen Therapy: Patient Spontanous Breathing and Patient connected to [place] 02  ? ?Post-op Assessment: Report given to PACU RN, Post -op Vital signs reviewed and stable and Patient moving all extremities ? ?Post vital signs: Reviewed and stable ? ?Complications: No apparent anesthesia complications ?

## 2021-11-21 NOTE — Anesthesia Preprocedure Evaluation (Addendum)
Anesthesia Evaluation  ? ? ?Reviewed: ?Allergy & Precautions, Patient's Chart, lab work & pertinent test results ? ?Airway ?Mallampati: I ? ?TM Distance: >3 FB ?Neck ROM: Full ? ? ? Dental ?no notable dental hx. ?(+) Teeth Intact, Dental Advisory Given ?  ?Pulmonary ?asthma , PE (2016, workup negative, completed eliquis) ?  ?Pulmonary exam normal ?breath sounds clear to auscultation ? ? ? ? ? ? Cardiovascular ?hypertension, Pt. on medications ?+ DVT  ?Normal cardiovascular exam ?Rhythm:Regular Rate:Normal ? ? ?  ?Neuro/Psych ?negative neurological ROS ? negative psych ROS  ? GI/Hepatic ?negative GI ROS, Neg liver ROS,   ?Endo/Other  ?negative endocrine ROS ? Renal/GU ?negative Renal ROS  ?negative genitourinary ?  ?Musculoskeletal ?negative musculoskeletal ROS ?(+)  ? Abdominal ?  ?Peds ? Hematology ?negative hematology ROS ?(+)   ?Anesthesia Other Findings ?Bladder lesion ? Reproductive/Obstetrics ? ?  ? ? ? ? ? ? ? ? ? ? ? ? ? ?  ?  ? ? ? ? ? ? ? ?Anesthesia Physical ?Anesthesia Plan ? ?ASA: 3 ? ?Anesthesia Plan: General  ? ?Post-op Pain Management: Toradol IV (intra-op)* and Tylenol PO (pre-op)*  ? ?Induction: Intravenous ? ?PONV Risk Score and Plan: 2 and Ondansetron, Dexamethasone and Midazolam ? ?Airway Management Planned: LMA ? ?Additional Equipment: None ? ?Intra-op Plan:  ? ?Post-operative Plan: Extubation in OR ? ?Informed Consent: I have reviewed the patients History and Physical, chart, labs and discussed the procedure including the risks, benefits and alternatives for the proposed anesthesia with the patient or authorized representative who has indicated his/her understanding and acceptance.  ? ? ? ?Dental advisory given ? ?Plan Discussed with: CRNA and Anesthesiologist ? ?Anesthesia Plan Comments:   ? ? ? ? ? ?Anesthesia Quick Evaluation ? ?

## 2021-11-21 NOTE — H&P (Signed)
?  ?CC/HPI: James Larsen is a 51 year old male seen in followup with recurrent urolithiasis, renal cysts and BPH/LUTS.  ? ?1. BPH/LUTS:  ?Patient presented in 08/2021 with a 76-monthhistory of progressively worsening lower urine tract symptoms. He stated that approximately 6 months prior, he developed signs and symptoms of urinary tract infection. He had some ciprofloxacin available to him so he took this with improved symptoms. At the time, he did state that he had a negative urinalysis. He does state that he has had some persistent frequency and urgency. He states his most bothersome symptom is urinary urgency.  ?-He does describe a sensation of pain with bladder filling that improves with bladder emptying.  ?-He was recently started on Jalyn (dutasteride/tamsulosin) and did not improvement however has persistent pain symptoms.  ?-He reports lower pelvic pain that is worse with bladder filling and declines with bladder emptying.  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  ? ?-He has had improved voiding symptoms over the past month however still note some persistent pelvic pain.  ?BPH evaluation 09/27/2021:  ?-TRUS volume 26.7 mL  ?-Cystoscopy with nonobstructing prostate. There is approximately 1 cm area of erythema in the posterior left wall of the bladder that could possibly be a Hunner lesion. This was not suspicious for malignancy. No papillary lesions were seen.  ?-Uroflow with peak flow rate 8.2 cc/second and average flow rate 4.9 cc/second.  ?-PVR 0 mL 6  ? ?#2. Prostate cancer screening: PSA in 08/18/2021 was 0.61. He denies a family history of prostate cancer.  ? ?#3. Urolithiasis:  ?-CT A/P 04/22/2020 revealed a 1 mm calculus in the distal right ureter near the UVJ with moderate hydronephrosis. No intrarenal calculus identified. He was able to pass the stone. He has had no stone episodes since this. Follow-up renal ultrasound on 06/09/2020 demonstrated resolved hydronephrosis.   ? ?He underwent metabolic evaluation. 24-hour urine on 06/28/2020 revealed borderline hyperoxaluria, borderline elevated urine pH and elevated uric acid. Counseled him to treat with a low oxalate diet, continue hydration to ensure adequate urine volume and reduce meat protein to treat his hyperuricosuria.  ? ?4. Simple renal cyst:  ?CT A/P 04/22/2020 also revealed right renal cyst measuring 2.8 cm in the right upper pole: Right interpolar cyst measuring 1.1 cm and right lower pole cyst measuring 2.8 cm with adjacent cystic 2.6 cm. There is also a left renal cyst measuring 1.2 cm in the left upper pole. He denies a family history of urologic malignancy.  ? ?Patient currently denies fever, chills, sweats, nausea, vomiting, abdominal or flank pain, gross hematuria or dysuria.  ? ?He is from [place]  ? ?  ?ALLERGIES: No Allergies ?  ? ?MEDICATIONS: Gemtesa 75 mg tablet 1 tablet PO Daily  ?Amitriptyline Hcl 25 mg tablet Oral  ?Aspirin 81 MG TABS Oral  ?Losartan-Hydrochlorothiazide 50 mg-12.5 mg tablet Oral  ?Trospium Chloride Er 60 mg capsule, ext release 24 hr 1 capsule PO Daily  ?  ? ?GU PSH: None  ?   ?PSH Notes: No Surgical Problems  ? ?NON-GU PSH: None  ? ?GU PMH: BPH w/LUTS - 08/18/2021 ?Chronic prostatitis - 08/18/2021 ?Encounter for Prostate Cancer screening - 08/18/2021 ?History of urolithiasis - 08/18/2021, - 09/14/2020, - 06/09/2020, - 06/02/2020, Nephrolithiasis, - 2014 ?Renal cyst - 08/18/2021, - 09/14/2020, - 06/02/2020, Renal cyst, acquired, right, - 2014 ?Urinary Frequency - 08/18/2021 ?Urinary Urgency - 08/18/2021 ?LLQ pain, Abdominal Pain In The Left Lower Belly (LLQ) - 2014 ?Other microscopic  hematuria, Microscopic Hematuria - 2014 ?Renal calculus, Nephrolithiasis - 2014 ?Ureteral calculus, Ureteral Stone - 2014 ?  ? ?NON-GU PMH: Personal history of other diseases of the circulatory system, History of hypertension - 2014 ?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, Hepatitis, C Virus - 2014 ?Encounter for general  adult medical examination without abnormal findings, Encounter for preventive health examination ?  ? ?FAMILY HISTORY: Acute Myocardial Infarction - Mother ?Death In The Family Father - Runs In Family ?Death In The Family Mother - Runs In Family ?Family Health Status Number - Runs In Family  ? ?SOCIAL HISTORY: None  ?  Notes: Never A Smoker  ? ?REVIEW OF SYSTEMS:    ?GU Review Male:   Patient denies frequent urination, hard to postpone urination, burning/ pain with urination, get up at night to urinate, leakage of urine, stream starts and stops, trouble starting your stream, have to strain to urinate , erection problems, and penile pain.  ?Gastrointestinal (Upper):   Patient denies nausea, vomiting, and indigestion/ heartburn.  ?Gastrointestinal (Lower):   Patient denies diarrhea and constipation.  ?Constitutional:   Patient denies fever, night sweats, weight loss, and fatigue.  ?Skin:   Patient denies skin rash/ lesion and itching.  ?Eyes:   Patient denies blurred vision and double vision.  ?Ears/ Nose/ Throat:   Patient denies sore throat and sinus problems.  ?Hematologic/Lymphatic:   Patient denies swollen glands and easy bruising.  ?Cardiovascular:   Patient denies chest pains and leg swelling.  ?Respiratory:   Patient denies cough and shortness of breath.  ?Endocrine:   Patient denies excessive thirst.  ?Musculoskeletal:   Patient denies back pain and joint pain.  ?Neurological:   Patient denies headaches and dizziness.  ?Psychologic:   Patient denies depression and anxiety.  ? ?VITAL SIGNS: None  ? ?MULTI-SYSTEM PHYSICAL EXAMINATION:    ?Constitutional: Well-nourished. No physical deformities. Normally developed. Good grooming.  ?Respiratory: No labored breathing, no use of accessory muscles.   ?Cardiovascular: Normal temperature, normal extremity pulses, no swelling, no varicosities.  ?Gastrointestinal: No mass, no tenderness, no rigidity, non obese abdomen.  ? ?  ?Complexity of Data:  ?Source Of History:   Patient, Medical Record Summary  ?Lab Test Review:   PSA  ?Records Review:   Previous Doctor Records  ?Urine Test Review:   Urinalysis  ?Urodynamics Review:   Review Bladder Scan  ?X-Ray Review: Prostate Ultrasound: Reviewed Films. Reviewed Report. Discussed With Patient.  ?  ? 08/18/21  ?PSA  ?Total PSA 0.61 ng/mL  ? ? ?PROCEDURES:    ?     Flexible Cystoscopy - 52000  ?Risks, benefits, and some of the potential complications of the procedure were discussed at length with the patient including infection, bleeding, voiding discomfort, urinary retention, fever, chills, sepsis, and others. All questions were answered. Informed consent was obtained. Antibiotic prophylaxis was given. Sterile technique and intraurethral analgesia were used.  ?Meatus:  Normal size. Normal location. Normal condition.  ?Urethra:  No strictures.  ?External Sphincter:  Normal.  ?Verumontanum:  Normal.  ?Prostate:  Non-obstructing. No hyperplasia.  ?Bladder Neck:  Non-obstructing.  ?Ureteral Orifices:  Normal location. Normal size. Normal shape. Effluxed clear urine.  ?Bladder:  No trabeculation. No tumors. No stones. Area of 1cm erythema on the posterior left aspect of the bladder. No papillary lesions.  ?  ?  ?The lower urinary tract was carefully examined. The procedure was well-tolerated and without complications. Antibiotic instructions were given. Instructions were given to call the office immediately for bloody urine,  difficulty urinating, urinary retention, painful or frequent urination, fever, chills, nausea, vomiting or other illness. The patient stated that he understood these instructions and would comply with them. ? ?  ? ?     Prostate Ultrasound - 94997  ?Length: 3.9 cm ?Height: 3.0 cm ?Width: 4.4 cm ?Volume: 26.7 ml  ?  ?  ?The transrectal ultrasound probe is introduced into the rectum, and the prostate is visualized. Ultrasonography is utilized throughout the procedure. At the conclusion of the procedure, the ultrasound probe  is removed. The patient tolerates the procedure without complication. ? ?Patient confirmed No Neulasta OnPro Device.  ?  ? ?     Flow Rate - 51741  ?Flow Time: 0:20 min:sec  ?Total Void Time: 0:20 min:sec

## 2021-11-22 ENCOUNTER — Encounter (HOSPITAL_BASED_OUTPATIENT_CLINIC_OR_DEPARTMENT_OTHER): Payer: Self-pay | Admitting: Urology

## 2021-11-22 LAB — SURGICAL PATHOLOGY

## 2021-11-28 DIAGNOSIS — R350 Frequency of micturition: Secondary | ICD-10-CM | POA: Diagnosis not present

## 2021-11-28 DIAGNOSIS — R3915 Urgency of urination: Secondary | ICD-10-CM | POA: Diagnosis not present

## 2021-11-28 DIAGNOSIS — N281 Cyst of kidney, acquired: Secondary | ICD-10-CM | POA: Diagnosis not present

## 2021-11-28 DIAGNOSIS — D090 Carcinoma in situ of bladder: Secondary | ICD-10-CM | POA: Diagnosis not present

## 2021-11-29 ENCOUNTER — Other Ambulatory Visit: Payer: Self-pay | Admitting: Internal Medicine

## 2021-11-29 DIAGNOSIS — Z131 Encounter for screening for diabetes mellitus: Secondary | ICD-10-CM | POA: Diagnosis not present

## 2021-11-29 DIAGNOSIS — Z0000 Encounter for general adult medical examination without abnormal findings: Secondary | ICD-10-CM | POA: Diagnosis not present

## 2021-11-29 DIAGNOSIS — J4520 Mild intermittent asthma, uncomplicated: Secondary | ICD-10-CM | POA: Diagnosis not present

## 2021-11-29 DIAGNOSIS — C679 Malignant neoplasm of bladder, unspecified: Secondary | ICD-10-CM | POA: Diagnosis not present

## 2021-11-29 DIAGNOSIS — E559 Vitamin D deficiency, unspecified: Secondary | ICD-10-CM | POA: Diagnosis not present

## 2021-11-29 DIAGNOSIS — Z13220 Encounter for screening for lipoid disorders: Secondary | ICD-10-CM | POA: Diagnosis not present

## 2021-11-29 DIAGNOSIS — I10 Essential (primary) hypertension: Secondary | ICD-10-CM | POA: Diagnosis not present

## 2021-11-30 LAB — COMPLETE METABOLIC PANEL WITH GFR
AG Ratio: 1.9 (calc) (ref 1.0–2.5)
ALT: 32 U/L (ref 9–46)
AST: 22 U/L (ref 10–35)
Albumin: 4.7 g/dL (ref 3.6–5.1)
Alkaline phosphatase (APISO): 41 U/L (ref 35–144)
BUN: 13 mg/dL (ref 7–25)
CO2: 25 mmol/L (ref 20–32)
Calcium: 9.5 mg/dL (ref 8.6–10.3)
Chloride: 104 mmol/L (ref 98–110)
Creat: 1.08 mg/dL (ref 0.70–1.30)
Globulin: 2.5 g/dL (calc) (ref 1.9–3.7)
Glucose, Bld: 101 mg/dL — ABNORMAL HIGH (ref 65–99)
Potassium: 3.4 mmol/L — ABNORMAL LOW (ref 3.5–5.3)
Sodium: 142 mmol/L (ref 135–146)
Total Bilirubin: 0.9 mg/dL (ref 0.2–1.2)
Total Protein: 7.2 g/dL (ref 6.1–8.1)
eGFR: 83 mL/min/{1.73_m2} (ref >=60)

## 2021-11-30 LAB — LIPID PANEL
Cholesterol: 231 mg/dL — ABNORMAL HIGH (ref <200)
HDL: 68 mg/dL (ref >=40)
LDL Cholesterol (Calc): 148 mg/dL (calc) — ABNORMAL HIGH
Non-HDL Cholesterol (Calc): 163 mg/dL (calc) — ABNORMAL HIGH (ref <130)
Total CHOL/HDL Ratio: 3.4 (calc) (ref <5.0)
Triglycerides: 60 mg/dL (ref <150)

## 2021-11-30 LAB — CBC
HCT: 50.9 % — ABNORMAL HIGH (ref 38.5–50.0)
Hemoglobin: 17.2 g/dL — ABNORMAL HIGH (ref 13.2–17.1)
MCH: 29.8 pg (ref 27.0–33.0)
MCHC: 33.8 g/dL (ref 32.0–36.0)
MCV: 88.1 fL (ref 80.0–100.0)
MPV: 10.2 fL (ref 7.5–12.5)
Platelets: 227 10*3/uL (ref 140–400)
RBC: 5.78 10*6/uL (ref 4.20–5.80)
RDW: 13 % (ref 11.0–15.0)
WBC: 3.5 10*3/uL — ABNORMAL LOW (ref 3.8–10.8)

## 2021-11-30 LAB — VITAMIN D 25 HYDROXY (VIT D DEFICIENCY, FRACTURES): Vit D, 25-Hydroxy: 26 ng/mL — ABNORMAL LOW (ref 30–100)

## 2021-11-30 LAB — TSH: TSH: 2.05 mIU/L (ref 0.40–4.50)

## 2021-12-01 DIAGNOSIS — D090 Carcinoma in situ of bladder: Secondary | ICD-10-CM | POA: Diagnosis not present

## 2021-12-01 DIAGNOSIS — C679 Malignant neoplasm of bladder, unspecified: Secondary | ICD-10-CM | POA: Diagnosis not present

## 2021-12-01 DIAGNOSIS — N281 Cyst of kidney, acquired: Secondary | ICD-10-CM | POA: Diagnosis not present

## 2021-12-06 DIAGNOSIS — D090 Carcinoma in situ of bladder: Secondary | ICD-10-CM | POA: Diagnosis not present

## 2021-12-06 DIAGNOSIS — Z5111 Encounter for antineoplastic chemotherapy: Secondary | ICD-10-CM | POA: Diagnosis not present

## 2021-12-13 DIAGNOSIS — I10 Essential (primary) hypertension: Secondary | ICD-10-CM | POA: Diagnosis not present

## 2021-12-13 DIAGNOSIS — Z5111 Encounter for antineoplastic chemotherapy: Secondary | ICD-10-CM | POA: Diagnosis not present

## 2021-12-13 DIAGNOSIS — C679 Malignant neoplasm of bladder, unspecified: Secondary | ICD-10-CM | POA: Diagnosis not present

## 2021-12-13 DIAGNOSIS — D090 Carcinoma in situ of bladder: Secondary | ICD-10-CM | POA: Diagnosis not present

## 2021-12-13 DIAGNOSIS — J4520 Mild intermittent asthma, uncomplicated: Secondary | ICD-10-CM | POA: Diagnosis not present

## 2021-12-20 DIAGNOSIS — Z5111 Encounter for antineoplastic chemotherapy: Secondary | ICD-10-CM | POA: Diagnosis not present

## 2021-12-20 DIAGNOSIS — D090 Carcinoma in situ of bladder: Secondary | ICD-10-CM | POA: Diagnosis not present

## 2021-12-27 DIAGNOSIS — D090 Carcinoma in situ of bladder: Secondary | ICD-10-CM | POA: Diagnosis not present

## 2021-12-27 DIAGNOSIS — R310 Gross hematuria: Secondary | ICD-10-CM | POA: Diagnosis not present

## 2022-01-03 DIAGNOSIS — Z5111 Encounter for antineoplastic chemotherapy: Secondary | ICD-10-CM | POA: Diagnosis not present

## 2022-01-03 DIAGNOSIS — D090 Carcinoma in situ of bladder: Secondary | ICD-10-CM | POA: Diagnosis not present

## 2022-01-10 DIAGNOSIS — Z5111 Encounter for antineoplastic chemotherapy: Secondary | ICD-10-CM | POA: Diagnosis not present

## 2022-01-10 DIAGNOSIS — D090 Carcinoma in situ of bladder: Secondary | ICD-10-CM | POA: Diagnosis not present

## 2022-01-11 ENCOUNTER — Emergency Department (HOSPITAL_COMMUNITY): Payer: BC Managed Care – PPO

## 2022-01-11 ENCOUNTER — Encounter (HOSPITAL_COMMUNITY): Payer: Self-pay

## 2022-01-11 ENCOUNTER — Emergency Department (HOSPITAL_COMMUNITY)
Admission: EM | Admit: 2022-01-11 | Discharge: 2022-01-11 | Disposition: A | Discharge status: Home / Self Care | Payer: BC Managed Care – PPO | Attending: Emergency Medicine | Admitting: Emergency Medicine

## 2022-01-11 ENCOUNTER — Other Ambulatory Visit: Payer: Self-pay

## 2022-01-11 DIAGNOSIS — N3289 Other specified disorders of bladder: Secondary | ICD-10-CM | POA: Diagnosis not present

## 2022-01-11 DIAGNOSIS — N281 Cyst of kidney, acquired: Secondary | ICD-10-CM | POA: Diagnosis not present

## 2022-01-11 DIAGNOSIS — Z8551 Personal history of malignant neoplasm of bladder: Secondary | ICD-10-CM | POA: Insufficient documentation

## 2022-01-11 DIAGNOSIS — R300 Dysuria: Secondary | ICD-10-CM | POA: Diagnosis not present

## 2022-01-11 DIAGNOSIS — N3001 Acute cystitis with hematuria: Secondary | ICD-10-CM | POA: Diagnosis not present

## 2022-01-11 DIAGNOSIS — Z7982 Long term (current) use of aspirin: Secondary | ICD-10-CM | POA: Diagnosis not present

## 2022-01-11 LAB — URINALYSIS, ROUTINE W REFLEX MICROSCOPIC
Bilirubin Urine: NEGATIVE
Glucose, UA: NEGATIVE mg/dL
Ketones, ur: NEGATIVE mg/dL
Nitrite: NEGATIVE
Protein, ur: >=300 mg/dL — AB
RBC / HPF: >50 RBC/hpf — ABNORMAL HIGH (ref 0–5)
Specific Gravity, Urine: 1.02 (ref 1.005–1.030)
WBC, UA: >50 WBC/hpf — ABNORMAL HIGH (ref 0–5)
pH: 5 (ref 5.0–8.0)

## 2022-01-11 LAB — CBC WITH DIFFERENTIAL/PLATELET
Abs Immature Granulocytes: 0 10*3/uL (ref 0.00–0.07)
Basophils Absolute: 0 10*3/uL (ref 0.0–0.1)
Basophils Relative: 1 %
Eosinophils Absolute: 0.2 10*3/uL (ref 0.0–0.5)
Eosinophils Relative: 5 %
HCT: 44.1 % (ref 39.0–52.0)
Hemoglobin: 15.4 g/dL (ref 13.0–17.0)
Immature Granulocytes: 0 %
Lymphocytes Relative: 40 %
Lymphs Abs: 1.4 10*3/uL (ref 0.7–4.0)
MCH: 30.8 pg (ref 26.0–34.0)
MCHC: 34.9 g/dL (ref 30.0–36.0)
MCV: 88.2 fL (ref 80.0–100.0)
Monocytes Absolute: 0.4 10*3/uL (ref 0.1–1.0)
Monocytes Relative: 13 %
Neutro Abs: 1.4 10*3/uL — ABNORMAL LOW (ref 1.7–7.7)
Neutrophils Relative %: 41 %
Platelets: 186 10*3/uL (ref 150–400)
RBC: 5 MIL/uL (ref 4.22–5.81)
RDW: 12.5 % (ref 11.5–15.5)
WBC: 3.4 10*3/uL — ABNORMAL LOW (ref 4.0–10.5)
nRBC: 0 % (ref 0.0–0.2)

## 2022-01-11 LAB — COMPREHENSIVE METABOLIC PANEL
ALT: 31 U/L (ref 0–44)
AST: 22 U/L (ref 15–41)
Albumin: 4.2 g/dL (ref 3.5–5.0)
Alkaline Phosphatase: 39 U/L (ref 38–126)
Anion gap: 5 (ref 5–15)
BUN: 15 mg/dL (ref 6–20)
CO2: 27 mmol/L (ref 22–32)
Calcium: 9 mg/dL (ref 8.9–10.3)
Chloride: 109 mmol/L (ref 98–111)
Creatinine, Ser: 1.21 mg/dL (ref 0.61–1.24)
GFR, Estimated: >60 mL/min (ref >60)
Glucose, Bld: 141 mg/dL — ABNORMAL HIGH (ref 70–99)
Potassium: 3.4 mmol/L — ABNORMAL LOW (ref 3.5–5.1)
Sodium: 141 mmol/L (ref 135–145)
Total Bilirubin: 0.7 mg/dL (ref 0.3–1.2)
Total Protein: 7.1 g/dL (ref 6.5–8.1)

## 2022-01-11 LAB — LIPASE, BLOOD: Lipase: 74 U/L — ABNORMAL HIGH (ref 11–51)

## 2022-01-11 MED ORDER — CEPHALEXIN 500 MG PO CAPS: 500.0000 mg | ORAL_CAPSULE | Freq: Three times a day (TID) | ORAL | 0 refills | Status: DC

## 2022-01-11 MED ORDER — SODIUM CHLORIDE 0.9 % IV SOLN: 1.0000 g | Freq: Once | INTRAVENOUS | Status: DC

## 2022-01-11 MED ORDER — SODIUM CHLORIDE 0.9 % IV SOLN
1.0000 g | Freq: Once | INTRAVENOUS | Status: AC
  Administered 2022-01-11: 1 g via INTRAVENOUS
  Filled 2022-01-11 (×2): qty 20

## 2022-01-11 MED ORDER — FLUCONAZOLE 150 MG PO TABS
150.0000 mg | ORAL_TABLET | Freq: Once | ORAL | Status: AC
  Administered 2022-01-11: 150 mg via ORAL
  Filled 2022-01-11: qty 1

## 2022-01-11 MED ORDER — ONDANSETRON HCL 4 MG/2ML IJ SOLN
4.0000 mg | Freq: Once | INTRAMUSCULAR | Status: DC
  Filled 2022-01-11: qty 2

## 2022-01-11 MED ORDER — KETOROLAC TROMETHAMINE 10 MG PO TABS: 10.0000 mg | ORAL_TABLET | Freq: Three times a day (TID) | ORAL | 0 refills | Status: DC | PRN

## 2022-01-11 MED ORDER — HYDROMORPHONE HCL 1 MG/ML IJ SOLN: 1.0000 mg | Freq: Once | INTRAMUSCULAR | Status: DC

## 2022-01-11 NOTE — ED Notes (Signed)
I provided reinforced discharge education based off of discharge instructions. Pt acknowledged and understood my education. Pt had no further questions/concerns for provider/myself. During my care of this pt, I had Company secretary. I informed RN of treatments/updates, and changes to pt condition. I had RN assist when required with treatment, and provide appropriate initial/necessary assessments.

## 2022-01-11 NOTE — ED Notes (Signed)
Pt transported to CT ?

## 2022-01-11 NOTE — Progress Notes (Signed)
A consult was received from an ED physician for Meropenem per pharmacy dosing. The patient's profile has been reviewed for ht/wt/allergies/indication/available labs.    A one time order has been placed for Meropenem 1g IV.  Further antibiotics/pharmacy consults should be ordered by admitting physician if indicated.                       Thank you, Luiz Ochoa 01/11/2022  9:17 PM

## 2022-01-11 NOTE — ED Notes (Signed)
Meropenum bag was not flowing, flushing during prime, following RN and EMT-P attempt, contacting pharmacy for a new unit.

## 2022-01-11 NOTE — ED Provider Notes (Signed)
[facility] DEPT Provider Note   CSN: [id number] Arrival date & time: 01/11/22  2027     History  Chief Complaint  Patient presents with   Flank Pain    Buck Mcaffee is a 51 y.o. male hx of kidney stone, bladder cancer on BCG treatments here with flank pain. Patient states that he just had BCG treatment yesterday. He states that he usually gets some dysuria and hematuria after the treatment but symptoms were worse since his treatment yesterday. He started having right flank pain since this morning. Took toradol and pain went down to 4/10 from 7/10. Felt nauseated but had no vomiting. He has history of kidney stone and was concerned that he has another stone.   The history is provided by the patient.      Home Medications Prior to Admission medications   Medication Sig Start Date End Date Taking? Authorizing Provider  aspirin EC 325 MG tablet Take 325 mg by mouth every evening.    [provider]  losartan-hydrochlorothiazide (HYZAAR) 50-12.5 MG per tablet Take 1 tablet by mouth every evening.    [provider]  traMADol (ULTRAM) 50 MG tablet Take 1 tablet (50 mg total) by mouth every 6 (six) hours as needed for up to 6 doses. 11/21/21   Janith Lima, MD      Allergies    Patient has no known allergies.    Review of Systems   Review of Systems  Genitourinary:  Positive for flank pain.  All other systems reviewed and are negative.  Physical Exam Updated Vital Signs BP (!) 181/119 (BP Location: Right Arm)   Pulse 65   Temp 98 F (36.7 C) (Oral)   Resp 18   SpO2 98%  Physical Exam Vitals and nursing note reviewed.  Constitutional:      Comments: Slightly uncomfortable   HENT:     Head: Normocephalic.     Nose: Nose normal.     Mouth/Throat:     Mouth: Mucous membranes are moist.  Eyes:     Extraocular Movements: Extraocular movements intact.     Pupils: Pupils are equal, round, and reactive to light.   Cardiovascular:     Rate and Rhythm: Normal rate and regular rhythm.     Pulses: Normal pulses.     Heart sounds: Normal heart sounds.  Pulmonary:     Effort: Pulmonary effort is normal.     Breath sounds: Normal breath sounds.  Abdominal:     General: Abdomen is flat.     Palpations: Abdomen is soft.     Comments: Mild R CVAT and suprapubic tenderness   Musculoskeletal:        General: Normal range of motion.     Cervical back: Normal range of motion and neck supple.  Skin:    General: Skin is warm.     Capillary Refill: Capillary refill takes less than 2 seconds.  Neurological:     General: No focal deficit present.     Mental Status: He is alert.  Psychiatric:        Mood and Affect: Mood normal.        Behavior: Behavior normal.    ED Results / Procedures / Treatments   Labs (all labs ordered are listed, but only abnormal results are displayed) Labs Reviewed  COMPREHENSIVE METABOLIC PANEL - Abnormal; Notable for the following components:      Result Value   Potassium 3.4 (*)    Glucose, Bld  141 (*)    All other components within normal limits  LIPASE, BLOOD - Abnormal; Notable for the following components:   Lipase 74 (*)    All other components within normal limits  CBC WITH DIFFERENTIAL/PLATELET - Abnormal; Notable for the following components:   WBC 3.4 (*)    Neutro Abs 1.4 (*)    All other components within normal limits  URINALYSIS, ROUTINE W REFLEX MICROSCOPIC - Abnormal; Notable for the following components:   APPearance CLOUDY (*)    Hgb urine dipstick LARGE (*)    Protein, ur >=300 (*)    Leukocytes,Ua LARGE (*)    RBC / HPF >50 (*)    WBC, UA >50 (*)    Bacteria, UA FEW (*)    All other components within normal limits  URINE CULTURE    EKG None  Radiology CT Renal Stone Study  Result Date: 01/11/2022 CLINICAL DATA:  Flank pain, kidney stone suspected. Right flank pain. EXAM: CT ABDOMEN AND PELVIS WITHOUT CONTRAST TECHNIQUE: Multidetector CT  imaging of the abdomen and pelvis was performed following the standard protocol without IV contrast. RADIATION DOSE REDUCTION: This exam was performed according to the departmental dose-optimization program which includes automated exposure control, adjustment of the mA and/or kV according to patient size and/or use of iterative reconstruction technique. COMPARISON:  CT examination dated December 01, 2021 FINDINGS: Lower chest: No acute abnormality. Hepatobiliary: No focal liver abnormality is seen. No gallstones, gallbladder wall thickening, or biliary dilatation. Pancreas: Unremarkable. No pancreatic ductal dilatation or surrounding inflammatory changes. Spleen: Normal in size without focal abnormality. Adrenals/Urinary Tract: Adrenal glands are unremarkable. Simple cyst in the upper and lower pole of the right kidney, which do not require follow-up. Kidneys are normal, without renal calculi, focal lesion, or hydronephrosis. Prominent bilateral ureters without evidence of ureteral calculus or periureteral fat stranding. Urinary bladder is underdistended. There is trace amount of air in the urinary bladder, which may be secondary to instrumentation or urinary tract infection. Correlate with urinalysis. Stomach/Bowel: Stomach is within normal limits. Appendix appears normal. No evidence of bowel wall thickening, distention, or inflammatory changes. Vascular/Lymphatic: No significant vascular findings are present. No enlarged abdominal or pelvic lymph nodes. Reproductive: Prostate is unremarkable. Other: No abdominal wall hernia or abnormality. No abdominopelvic ascites. Musculoskeletal: No acute or significant osseous findings. IMPRESSION: 1. No evidence of nephrolithiasis or hydronephrosis. No ureteral calculus. 2. Urinary bladder wall thickening with trace amount of air in the urinary bladder, which could be secondary to instrumentation or urinary tract infection. Correlate with urinalysis. 3. Simple cysts in the  right kidney, which do not require follow-up. Electronically Signed   By: Keane Police D.O.   On: 01/11/2022 22:03    Procedures Procedures    Medications Ordered in ED Medications  HYDROmorphone (DILAUDID) injection 1 mg (0 mg Intravenous Hold 01/11/22 2122)  ondansetron (ZOFRAN) injection 4 mg (0 mg Intravenous Hold 01/11/22 2122)  meropenem (MERREM) 1 g in sodium chloride 0.9 % 100 mL IVPB (has no administration in time range)    ED Course/ Medical Decision Making/ A&P                           Medical Decision Making Askari Kinley is a 51 y.o. male here with dysuria, hematuria, flank pain after BCG yesterday. Consider side effect of BCG vs interstitial cystitis vs UTI vs pyelonephritis vs renal colic. Will get cbc, cmp, UA, CT renal stone. Patient took toradol and  wants to hold off on pain meds right now.   10:53 PM I reviewed patient's labs and independently interpreted his imaging study. Patient has large leuk and large RBC and yeast. I talked to urologist on call, Dr. Lahoma Crocker. She reviewed the urine culture in the clinic and recent cultures are negative. Unclear if its a contaminant or UTI. I ordered diflucan. Also given meropenem in the ED since urine culture in 2021 showed morganella that is resistant to rocephin. However, since recent cultures are negative, Dr. Lahoma Crocker agreed with dc home with keflex pending cultures. Patient doesn't want narcotics. Requesting toradol prescription.   Problems Addressed: Acute cystitis with hematuria: acute illness or injury Dysuria: acute illness or injury  Amount and/or Complexity of Data Reviewed Labs: ordered. Decision-making details documented in ED Course. Radiology: ordered and independent interpretation performed. Decision-making details documented in ED Course.  Risk Prescription drug management.    Final Clinical Impression(s) / ED Diagnoses Final diagnoses:  None    Rx / DC Orders ED Discharge Orders     None          Drenda Freeze, MD 01/11/22 2256

## 2022-01-11 NOTE — ED Triage Notes (Signed)
Pt reports with right flank pain since today around 2 pm. Pt has a hx of kidney stones.

## 2022-01-11 NOTE — Discharge Instructions (Signed)
You likely have side effect of BCG or urinary tract infection   Take toradol as needed for pain   Take keflex three times daily for 5 days for UTI. Urine culture are sent and if it doesn't show any bacteria you should stop antibiotics. You have yeast in your urine and you were given a dose of diflucan   See urology for follow up   Return to ER if you have worse trouble urinating, pain with urination, flank pain, fever, vomiting

## 2022-01-12 LAB — URINE CULTURE: Culture: NO GROWTH

## 2022-01-13 DIAGNOSIS — N3281 Overactive bladder: Secondary | ICD-10-CM | POA: Diagnosis not present

## 2022-01-13 DIAGNOSIS — J4520 Mild intermittent asthma, uncomplicated: Secondary | ICD-10-CM | POA: Diagnosis not present

## 2022-01-13 DIAGNOSIS — I10 Essential (primary) hypertension: Secondary | ICD-10-CM | POA: Diagnosis not present

## 2022-01-13 DIAGNOSIS — C679 Malignant neoplasm of bladder, unspecified: Secondary | ICD-10-CM | POA: Diagnosis not present

## 2022-01-17 DIAGNOSIS — D090 Carcinoma in situ of bladder: Secondary | ICD-10-CM | POA: Diagnosis not present

## 2022-01-17 DIAGNOSIS — R350 Frequency of micturition: Secondary | ICD-10-CM | POA: Diagnosis not present

## 2022-01-23 DIAGNOSIS — Z5111 Encounter for antineoplastic chemotherapy: Secondary | ICD-10-CM | POA: Diagnosis not present

## 2022-01-23 DIAGNOSIS — D090 Carcinoma in situ of bladder: Secondary | ICD-10-CM | POA: Diagnosis not present

## 2022-01-30 DIAGNOSIS — I10 Essential (primary) hypertension: Secondary | ICD-10-CM | POA: Diagnosis not present

## 2022-01-30 DIAGNOSIS — C679 Malignant neoplasm of bladder, unspecified: Secondary | ICD-10-CM | POA: Diagnosis not present

## 2022-01-30 DIAGNOSIS — J4520 Mild intermittent asthma, uncomplicated: Secondary | ICD-10-CM | POA: Diagnosis not present

## 2022-02-06 DIAGNOSIS — R8271 Bacteriuria: Secondary | ICD-10-CM | POA: Diagnosis not present

## 2022-02-06 DIAGNOSIS — R300 Dysuria: Secondary | ICD-10-CM | POA: Diagnosis not present

## 2022-02-06 DIAGNOSIS — D090 Carcinoma in situ of bladder: Secondary | ICD-10-CM | POA: Diagnosis not present

## 2022-02-06 DIAGNOSIS — R3982 Chronic bladder pain: Secondary | ICD-10-CM | POA: Diagnosis not present

## 2022-03-07 DIAGNOSIS — D090 Carcinoma in situ of bladder: Secondary | ICD-10-CM | POA: Diagnosis not present

## 2022-03-07 DIAGNOSIS — R3915 Urgency of urination: Secondary | ICD-10-CM | POA: Diagnosis not present

## 2022-03-13 ENCOUNTER — Other Ambulatory Visit: Payer: Self-pay | Admitting: Urology

## 2022-03-14 NOTE — Progress Notes (Addendum)
COVID Vaccine Completed:  Date of COVID positive in last 90 days:  PCP - Nolene Ebbs, MD Cardiologist -   Chest x-ray -  EKG - 11-21-21 Epic Stress Test - greater than 2 years Epic ECHO -  Cardiac Cath -  Pacemaker/ICD device last checked: Spinal Cord Stimulator:  Bowel Prep -   Sleep Study -  CPAP -   Fasting Blood Sugar -  Checks Blood Sugar _____ times a day  Blood Thinner Instructions: Aspirin Instructions:  ASA 325 mg Last Dose:  Activity level:  Can go up a flight of stairs and perform activities of daily living without stopping and without symptoms of chest pain or shortness of breath.  Able to exercise without symptoms  Unable to go up a flight of stairs without symptoms of     Anesthesia review:  Hx of PE and DVT  Patient denies shortness of breath, fever, cough and chest pain at PAT appointment  Patient verbalized understanding of instructions that were given to them at the PAT appointment. Patient was also instructed that they will need to review over the PAT instructions again at home before surgery.

## 2022-03-14 NOTE — Progress Notes (Signed)
PCP - Nolene Ebbs, MD Cardiologist - N/A   Chest x-ray - N/A EKG - 11-21-21 Epic Stress Test - greater than 2 years Epic ECHO -  greater than 2 years Cardiac Cath - N/A Pacemaker/ICD device last checked:N/A Spinal Cord Stimulator: N/A   Bowel Prep - N/A   Sleep Study - N/A CPAP - N/A   Fasting Blood Sugar - N/A Checks Blood Sugar __N/A___ times a day   Blood Thinner Instructions:N/A Aspirin Instructions:  ASA 325 mg Last Dose: will stop 5 days prior to surgery   Activity level:   Able to exercise without symptoms     Anesthesia review:  Blood pressure during PST 155/104 Hx of PE and DVT   Patient denies shortness of breath, fever, cough and chest pain at PAT appointment   Patient verbalized understanding of instructions that were given to them at the PAT appointment. Patient was also instructed that they will need to review over the PAT instructions again at home before surgery.

## 2022-03-14 NOTE — Patient Instructions (Addendum)
SURGICAL WAITING ROOM VISITATION Patients having surgery or a procedure may have no more than 2 support people in the waiting area - these visitors may rotate.   Children under the age of 60 must have an adult with them who is not the patient. If the patient needs to stay at the hospital during part of their recovery, the visitor guidelines for inpatient rooms apply. Pre-op nurse will coordinate an appropriate time for 1 support person to accompany patient in pre-op.  This support person may not rotate.    Please refer to the [facility] website for the visitor guidelines for Inpatients (after your surgery is over and you are in a regular room).      Your procedure is scheduled on: 03-24-22   Report to [facility] Main Entrance    Report to admitting at 7:45 AM   Call this number if you have problems the morning of surgery [phone]   Do not eat food :After Midnight.   After Midnight you may have the following liquids until 7:00 AM DAY OF SURGERY  Water Non-Citrus Juices (without pulp, NO RED) Carbonated Beverages Black Coffee (NO MILK/CREAM OR CREAMERS, sugar ok)  Clear Tea (NO MILK/CREAM OR CREAMERS, sugar ok) regular and decaf                             Plain Jell-O (NO RED)                                           Fruit ices (not with fruit pulp, NO RED)                                     Popsicles (NO RED)                                                               Sports drinks like Gatorade (NO RED)                      If you have questions, please contact your surgeon's office.   FOLLOW  ANY ADDITIONAL PRE OP INSTRUCTIONS YOU RECEIVED FROM YOUR SURGEON'S OFFICE!!!     Oral Hygiene is also important to reduce your risk of infection.                                    Remember - BRUSH YOUR TEETH THE MORNING OF SURGERY WITH YOUR REGULAR TOOTHPASTE   Do NOT smoke after Midnight   Take these medicines the morning of surgery with A SIP OF WATER:  Tramadol  if needed                              You may not have any metal on your body including jewelry, and body piercing             Do not wear  lotions, powders, cologne, or  deodorant              Men may shave face and neck.   Do not bring valuables to the hospital. [place].   Contacts, dentures or bridgework may not be worn into surgery.  DO NOT [place]. PHARMACY WILL DISPENSE MEDICATIONS LISTED ON YOUR MEDICATION LIST TO YOU DURING YOUR ADMISSION [place]!   Patients discharged on the day of surgery will not be allowed to drive home.  Someone NEEDS to stay with you for the first 24 hours after anesthesia.  Special Instructions: Bring a copy of your healthcare power of attorney and living will documents the day of surgery if you haven't scanned them before.  Please read over the following fact sheets you were given: IF YOU HAVE QUESTIONS ABOUT YOUR PRE-OP INSTRUCTIONS PLEASE CALL [place] - Preparing for Surgery Before surgery, you can play an important role.  Because skin is not sterile, your skin needs to be as free of germs as possible.  You can reduce the number of germs on your skin by washing with CHG (chlorahexidine gluconate) soap before surgery.  CHG is an antiseptic cleaner which kills germs and bonds with the skin to continue killing germs even after washing. Please DO NOT use if you have an allergy to CHG or antibacterial soaps.  If your skin becomes reddened/irritated stop using the CHG and inform your nurse when you arrive at Short Stay. Do not shave (including legs and underarms) for at least 48 hours prior to the first CHG shower.  You may shave your face/neck.  Please follow these instructions carefully:  1.  Shower with CHG Soap the night before surgery and the  morning of surgery.  2.  If you choose to wash your hair, wash your hair first as usual with your normal   shampoo.  3.  After you shampoo, rinse your hair and body thoroughly to remove the shampoo.                             4.  Use CHG as you would any other liquid soap.  You can apply chg directly to the skin and wash.  Gently with a scrungie or clean washcloth.  5.  Apply the CHG Soap to your body ONLY FROM THE NECK DOWN.   Do   not use on face/ open                           Wound or open sores. Avoid contact with eyes, ears mouth and   genitals (private parts).                       Wash face,  Genitals (private parts) with your normal soap.             6.  Wash thoroughly, paying special attention to the area where your    surgery  will be performed.  7.  Thoroughly rinse your body with warm water from the neck down.  8.  DO NOT shower/wash with your normal soap after using and rinsing off the CHG Soap.                9.  Pat yourself dry with a clean towel.  10.  Wear clean pajamas.            11.  Place clean sheets on your bed the night of your first shower and do not  sleep with pets. Day of Surgery : Do not apply any lotions/deodorants the morning of surgery.  Please wear clean clothes to the hospital/surgery center.  FAILURE TO FOLLOW THESE INSTRUCTIONS MAY RESULT IN THE CANCELLATION OF YOUR SURGERY  PATIENT SIGNATURE_________________________________  NURSE SIGNATURE__________________________________  ________________________________________________________________________

## 2022-03-15 ENCOUNTER — Encounter (HOSPITAL_COMMUNITY)
Admission: RE | Admit: 2022-03-15 | Discharge: 2022-03-15 | Disposition: A | Discharge status: Home / Self Care | Payer: BC Managed Care – PPO | Source: Ambulatory Visit | Attending: Urology | Admitting: Urology

## 2022-03-15 ENCOUNTER — Other Ambulatory Visit: Payer: Self-pay

## 2022-03-15 ENCOUNTER — Encounter (HOSPITAL_COMMUNITY): Payer: Self-pay

## 2022-03-15 VITALS — BP 155/104 | HR 59 | Temp 98.4°F | Resp 16 | Ht 71.0 in | Wt 222.0 lb

## 2022-03-15 DIAGNOSIS — I2510 Atherosclerotic heart disease of native coronary artery without angina pectoris: Secondary | ICD-10-CM | POA: Diagnosis not present

## 2022-03-15 DIAGNOSIS — Z01812 Encounter for preprocedural laboratory examination: Secondary | ICD-10-CM | POA: Insufficient documentation

## 2022-03-15 HISTORY — DX: Malignant (primary) neoplasm, unspecified: C80.1

## 2022-03-15 HISTORY — DX: Unspecified viral hepatitis B without hepatic coma: B19.10

## 2022-03-15 LAB — BASIC METABOLIC PANEL
Anion gap: 7 (ref 5–15)
BUN: 13 mg/dL (ref 6–20)
CO2: 28 mmol/L (ref 22–32)
Calcium: 9.1 mg/dL (ref 8.9–10.3)
Chloride: 105 mmol/L (ref 98–111)
Creatinine, Ser: 1.31 mg/dL — ABNORMAL HIGH (ref 0.61–1.24)
GFR, Estimated: >60 mL/min (ref >60)
Glucose, Bld: 91 mg/dL (ref 70–99)
Potassium: 3.7 mmol/L (ref 3.5–5.1)
Sodium: 140 mmol/L (ref 135–145)

## 2022-03-24 DIAGNOSIS — I2510 Atherosclerotic heart disease of native coronary artery without angina pectoris: Secondary | ICD-10-CM

## 2022-03-31 NOTE — Progress Notes (Addendum)
Spoke to patient and advised him of arrival time of 1115 for surgery on 04-03-22.  Patient reminded no solid food after midnight and that he may have clear liquids until 1030 the day of surgery.    Patient has no medications that he needs to take morning of surgery.  Patient stated that his last dose of Aspirin 325 was 03-29-22.    No change in medical history.  Patient's wife will be picking him up from surgery.    All questions answered.

## 2022-04-03 ENCOUNTER — Ambulatory Visit (HOSPITAL_COMMUNITY): Payer: BC Managed Care – PPO | Admitting: Anesthesiology

## 2022-04-03 ENCOUNTER — Encounter (HOSPITAL_COMMUNITY)
Admission: RE | Disposition: A | Discharge status: Home / Self Care | Payer: Self-pay | Source: Ambulatory Visit | Attending: Urology

## 2022-04-03 ENCOUNTER — Ambulatory Visit (HOSPITAL_COMMUNITY)
Admission: RE | Admit: 2022-04-03 | Discharge: 2022-04-03 | Disposition: A | Discharge status: Home / Self Care | Payer: BC Managed Care – PPO | Source: Ambulatory Visit | Attending: Urology | Admitting: Urology

## 2022-04-03 ENCOUNTER — Encounter (HOSPITAL_COMMUNITY): Payer: Self-pay | Admitting: Urology

## 2022-04-03 ENCOUNTER — Ambulatory Visit (HOSPITAL_COMMUNITY): Payer: BC Managed Care – PPO | Admitting: Physician Assistant

## 2022-04-03 DIAGNOSIS — K759 Inflammatory liver disease, unspecified: Secondary | ICD-10-CM | POA: Diagnosis not present

## 2022-04-03 DIAGNOSIS — I10 Essential (primary) hypertension: Secondary | ICD-10-CM | POA: Diagnosis not present

## 2022-04-03 DIAGNOSIS — N281 Cyst of kidney, acquired: Secondary | ICD-10-CM | POA: Diagnosis not present

## 2022-04-03 DIAGNOSIS — D090 Carcinoma in situ of bladder: Secondary | ICD-10-CM | POA: Insufficient documentation

## 2022-04-03 DIAGNOSIS — Z86718 Personal history of other venous thrombosis and embolism: Secondary | ICD-10-CM | POA: Insufficient documentation

## 2022-04-03 DIAGNOSIS — N132 Hydronephrosis with renal and ureteral calculous obstruction: Secondary | ICD-10-CM | POA: Diagnosis not present

## 2022-04-03 DIAGNOSIS — N3289 Other specified disorders of bladder: Secondary | ICD-10-CM | POA: Diagnosis not present

## 2022-04-03 DIAGNOSIS — J45909 Unspecified asthma, uncomplicated: Secondary | ICD-10-CM | POA: Insufficient documentation

## 2022-04-03 DIAGNOSIS — I2510 Atherosclerotic heart disease of native coronary artery without angina pectoris: Secondary | ICD-10-CM

## 2022-04-03 DIAGNOSIS — N3020 Other chronic cystitis without hematuria: Secondary | ICD-10-CM | POA: Diagnosis not present

## 2022-04-03 HISTORY — PX: CYSTOSCOPY WITH BIOPSY: SHX5122

## 2022-04-03 LAB — CBC WITH DIFFERENTIAL/PLATELET
Abs Immature Granulocytes: 0 10*3/uL (ref 0.00–0.07)
Basophils Absolute: 0 10*3/uL (ref 0.0–0.1)
Basophils Relative: 1 %
Eosinophils Absolute: 0.1 10*3/uL (ref 0.0–0.5)
Eosinophils Relative: 3 %
HCT: 47.4 % (ref 39.0–52.0)
Hemoglobin: 16.5 g/dL (ref 13.0–17.0)
Immature Granulocytes: 0 %
Lymphocytes Relative: 51 %
Lymphs Abs: 1.5 10*3/uL (ref 0.7–4.0)
MCH: 29.9 pg (ref 26.0–34.0)
MCHC: 34.8 g/dL (ref 30.0–36.0)
MCV: 85.9 fL (ref 80.0–100.0)
Monocytes Absolute: 0.4 10*3/uL (ref 0.1–1.0)
Monocytes Relative: 13 %
Neutro Abs: 1 10*3/uL — ABNORMAL LOW (ref 1.7–7.7)
Neutrophils Relative %: 32 %
Platelets: 192 10*3/uL (ref 150–400)
RBC: 5.52 MIL/uL (ref 4.22–5.81)
RDW: 12.2 % (ref 11.5–15.5)
WBC: 3 10*3/uL — ABNORMAL LOW (ref 4.0–10.5)
nRBC: 0 % (ref 0.0–0.2)

## 2022-04-03 SURGERY — CYSTOSCOPY, WITH BIOPSY
Anesthesia: General

## 2022-04-03 MED ORDER — FENTANYL CITRATE (PF) 100 MCG/2ML IJ SOLN
INTRAMUSCULAR | Status: DC | PRN
  Administered 2022-04-03: 100 ug via INTRAVENOUS
  Administered 2022-04-03: 50 ug via INTRAVENOUS

## 2022-04-03 MED ORDER — OXYCODONE HCL 5 MG/5ML PO SOLN: 5.0000 mg | Freq: Once | ORAL | Status: DC | PRN

## 2022-04-03 MED ORDER — FENTANYL CITRATE (PF) 100 MCG/2ML IJ SOLN
INTRAMUSCULAR | Status: AC
  Filled 2022-04-03: qty 2

## 2022-04-03 MED ORDER — LIDOCAINE 2% (20 MG/ML) 5 ML SYRINGE
INTRAMUSCULAR | Status: DC | PRN
  Administered 2022-04-03: 100 mg via INTRAVENOUS

## 2022-04-03 MED ORDER — PROPOFOL 10 MG/ML IV BOLUS
INTRAVENOUS | Status: DC | PRN
  Administered 2022-04-03: 170 mg via INTRAVENOUS
  Administered 2022-04-03: 30 mg via INTRAVENOUS

## 2022-04-03 MED ORDER — MIDAZOLAM HCL 2 MG/2ML IJ SOLN
INTRAMUSCULAR | Status: AC
  Filled 2022-04-03: qty 2

## 2022-04-03 MED ORDER — HYDRALAZINE HCL 20 MG/ML IJ SOLN
INTRAMUSCULAR | Status: AC
  Filled 2022-04-03: qty 1

## 2022-04-03 MED ORDER — ACETAMINOPHEN 500 MG PO TABS: 1000.0000 mg | ORAL_TABLET | Freq: Once | ORAL | Status: DC

## 2022-04-03 MED ORDER — LIDOCAINE 2% (20 MG/ML) 5 ML SYRINGE
INTRAMUSCULAR | Status: AC
  Filled 2022-04-03: qty 5

## 2022-04-03 MED ORDER — PROMETHAZINE HCL 25 MG/ML IJ SOLN: 6.2500 mg | INTRAMUSCULAR | Status: DC | PRN

## 2022-04-03 MED ORDER — DOCUSATE SODIUM 100 MG PO CAPS: 100.0000 mg | ORAL_CAPSULE | Freq: Every day | ORAL | 0 refills | Status: DC | PRN

## 2022-04-03 MED ORDER — LABETALOL HCL 5 MG/ML IV SOLN
INTRAVENOUS | Status: DC | PRN
  Administered 2022-04-03 (×3): 5 mg via INTRAVENOUS

## 2022-04-03 MED ORDER — FENTANYL CITRATE PF 50 MCG/ML IJ SOSY
25.0000 ug | PREFILLED_SYRINGE | INTRAMUSCULAR | Status: DC | PRN
  Administered 2022-04-03: 50 ug via INTRAVENOUS

## 2022-04-03 MED ORDER — HYDRALAZINE HCL 20 MG/ML IJ SOLN
10.0000 mg | Freq: Once | INTRAMUSCULAR | Status: AC
  Administered 2022-04-03: 10 mg via INTRAVENOUS

## 2022-04-03 MED ORDER — ONDANSETRON HCL 4 MG/2ML IJ SOLN
INTRAMUSCULAR | Status: AC
  Filled 2022-04-03: qty 8

## 2022-04-03 MED ORDER — MIDAZOLAM HCL 2 MG/2ML IJ SOLN
INTRAMUSCULAR | Status: DC | PRN
  Administered 2022-04-03: 2 mg via INTRAVENOUS

## 2022-04-03 MED ORDER — 0.9 % SODIUM CHLORIDE (POUR BTL) OPTIME
TOPICAL | Status: DC | PRN
  Administered 2022-04-03: 1000 mL

## 2022-04-03 MED ORDER — SUCCINYLCHOLINE CHLORIDE 200 MG/10ML IV SOSY
PREFILLED_SYRINGE | INTRAVENOUS | Status: AC
  Filled 2022-04-03: qty 10

## 2022-04-03 MED ORDER — ROCURONIUM BROMIDE 10 MG/ML (PF) SYRINGE
PREFILLED_SYRINGE | INTRAVENOUS | Status: AC
  Filled 2022-04-03: qty 10

## 2022-04-03 MED ORDER — OXYCODONE HCL 5 MG PO TABS: 5.0000 mg | ORAL_TABLET | Freq: Once | ORAL | Status: DC | PRN

## 2022-04-03 MED ORDER — FENTANYL CITRATE PF 50 MCG/ML IJ SOSY
PREFILLED_SYRINGE | INTRAMUSCULAR | Status: AC
  Filled 2022-04-03: qty 2

## 2022-04-03 MED ORDER — CHLORHEXIDINE GLUCONATE 0.12 % MT SOLN
15.0000 mL | Freq: Once | OROMUCOSAL | Status: AC
  Administered 2022-04-03: 15 mL via OROMUCOSAL

## 2022-04-03 MED ORDER — CEFAZOLIN SODIUM-DEXTROSE 2-4 GM/100ML-% IV SOLN
2.0000 g | Freq: Once | INTRAVENOUS | Status: AC
  Administered 2022-04-03: 2 g via INTRAVENOUS
  Filled 2022-04-03: qty 100

## 2022-04-03 MED ORDER — PHENYLEPHRINE 80 MCG/ML (10ML) SYRINGE FOR IV PUSH (FOR BLOOD PRESSURE SUPPORT)
PREFILLED_SYRINGE | INTRAVENOUS | Status: AC
  Filled 2022-04-03: qty 10

## 2022-04-03 MED ORDER — ROCURONIUM BROMIDE 10 MG/ML (PF) SYRINGE
PREFILLED_SYRINGE | INTRAVENOUS | Status: DC | PRN
  Administered 2022-04-03: 60 mg via INTRAVENOUS

## 2022-04-03 MED ORDER — OXYCODONE-ACETAMINOPHEN 5-325 MG PO TABS: 1.0000 | ORAL_TABLET | ORAL | 0 refills | Status: DC | PRN

## 2022-04-03 MED ORDER — ONDANSETRON HCL 4 MG/2ML IJ SOLN
INTRAMUSCULAR | Status: AC
  Filled 2022-04-03: qty 2

## 2022-04-03 MED ORDER — SODIUM CHLORIDE 0.9 % IR SOLN
Status: DC | PRN
  Administered 2022-04-03: 9000 mL

## 2022-04-03 MED ORDER — SUGAMMADEX SODIUM 200 MG/2ML IV SOLN
INTRAVENOUS | Status: DC | PRN
  Administered 2022-04-03: 400 mg via INTRAVENOUS

## 2022-04-03 MED ORDER — ORAL CARE MOUTH RINSE: 15.0000 mL | Freq: Once | OROMUCOSAL | Status: AC

## 2022-04-03 MED ORDER — EPHEDRINE 5 MG/ML INJ
INTRAVENOUS | Status: AC
  Filled 2022-04-03: qty 5

## 2022-04-03 MED ORDER — LACTATED RINGERS IV SOLN: INTRAVENOUS | Status: DC

## 2022-04-03 MED ORDER — LABETALOL HCL 5 MG/ML IV SOLN
INTRAVENOUS | Status: AC
  Filled 2022-04-03: qty 4

## 2022-04-03 MED ORDER — CEPHALEXIN 500 MG PO CAPS: 500.0000 mg | ORAL_CAPSULE | Freq: Two times a day (BID) | ORAL | 0 refills | Status: AC

## 2022-04-03 MED ORDER — DEXAMETHASONE SODIUM PHOSPHATE 10 MG/ML IJ SOLN
INTRAMUSCULAR | Status: AC
  Filled 2022-04-03: qty 1

## 2022-04-03 MED ORDER — MEPERIDINE HCL 50 MG/ML IJ SOLN: 6.2500 mg | INTRAMUSCULAR | Status: DC | PRN

## 2022-04-03 MED ORDER — DEXAMETHASONE SODIUM PHOSPHATE 10 MG/ML IJ SOLN
INTRAMUSCULAR | Status: DC | PRN
  Administered 2022-04-03: 10 mg via INTRAVENOUS

## 2022-04-03 MED ORDER — PROPOFOL 10 MG/ML IV BOLUS
INTRAVENOUS | Status: AC
  Filled 2022-04-03: qty 20

## 2022-04-03 MED ORDER — ONDANSETRON HCL 4 MG/2ML IJ SOLN
INTRAMUSCULAR | Status: DC | PRN
  Administered 2022-04-03: 4 mg via INTRAVENOUS

## 2022-04-03 MED ORDER — DEXAMETHASONE SODIUM PHOSPHATE 10 MG/ML IJ SOLN
INTRAMUSCULAR | Status: AC
Start: 2022-04-03 — End: ?
  Filled 2022-04-03: qty 1

## 2022-04-03 SURGICAL SUPPLY — 22 items
BAG URINE DRAIN 2000ML AR STRL (UROLOGICAL SUPPLIES) IMPLANT
BAG URINE LEG 500ML (DRAIN) IMPLANT
BAG URO CATCHER STRL LF (MISCELLANEOUS) ×1 IMPLANT
CATH FOLEY 2WAY SLVR  5CC 18FR (CATHETERS) ×1
CATH FOLEY 2WAY SLVR 5CC 18FR (CATHETERS) IMPLANT
DRAPE FOOT SWITCH (DRAPES) ×1 IMPLANT
ELECT REM PT RETURN 15FT ADLT (MISCELLANEOUS) ×1 IMPLANT
EVACUATOR MICROVAS BLADDER (UROLOGICAL SUPPLIES) IMPLANT
GLOVE SURG LX 7.5 STRW (GLOVE) ×1
GLOVE SURG LX STRL 7.5 STRW (GLOVE) ×1 IMPLANT
GOWN STRL REUS W/ TWL XL LVL3 (GOWN DISPOSABLE) ×1 IMPLANT
GOWN STRL REUS W/TWL XL LVL3 (GOWN DISPOSABLE) ×1
KIT TURNOVER KIT A (KITS) IMPLANT
LOOP CUT BIPOLAR 24F LRG (ELECTROSURGICAL) IMPLANT
MANIFOLD NEPTUNE II (INSTRUMENTS) ×1 IMPLANT
PACK CYSTO (CUSTOM PROCEDURE TRAY) ×1 IMPLANT
PAD TELFA 2X3 NADH STRL (GAUZE/BANDAGES/DRESSINGS) IMPLANT
SYR TOOMEY IRRIG 70ML (MISCELLANEOUS)
SYRINGE TOOMEY IRRIG 70ML (MISCELLANEOUS) IMPLANT
TUBING CONNECTING 10 (TUBING) ×1 IMPLANT
TUBING UROLOGY SET (TUBING) ×1 IMPLANT
WATER STERILE IRR 500ML POUR (IV SOLUTION) IMPLANT

## 2022-04-03 NOTE — Op Note (Signed)
Operative Note  Preoperative diagnosis:  1.  CIS of bladder  Postoperative diagnosis: 1.  CIS of bladder  Procedure(s): 1.  TURBT small  Surgeon: Rexene Alberts, MD  Assistants:  None  Anesthesia:  General  Complications:  None  EBL:  Minimal  Specimens: 1.  ID Type Source Tests Collected by Time Destination  1 : Left lateral wall bladder lesion Tissue PATH GU resection / TURBT / partial nephrectomy SURGICAL PATHOLOGY Janith Lima, MD 04/03/2022 1409   2 : Bladder dome bladder lesion  Tissue PATH GU resection / TURBT / partial nephrectomy SURGICAL PATHOLOGY Janith Lima, MD 04/03/2022 1417    Drains/Catheters: 1.  18 Fr foley catheter  Intraoperative findings:   3x3cm area of erythema on the left lateral wall suspicious for CIS and 2.1cm area of erythema on the dome suspicious for CIS. These areas were widely resected and fulgurated. Excellent hemostasis.  Number of tumors:        2 Size of largest tumor:                  3 cm    Characteristics of tumors:     Flat  Recurrent   Yes     Primary   No  Suspicious for Carcinoma in situ:    Yes  Clinical tumor stage:         cTis     Bimanual exam under anesthesia:        Performed - No evidence of 3D mass  Visually complete resection:                 Yes  Visualization of detrusor muscle in resection base:        Yes   Visual evaluation for perforation:             Performed - no evidence of perforation   Indication:  James Larsen is a 51 y.o. 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 Surveillance cystoscopy after induction with concern for persistent CIS. All the risks, benefits were discussed with the patient to include but not limited to infection, pain, bleeding, damage to adjacent structures, need for further operations, adverse reaction to anesthesia and death.  Patient understands these risks and agrees to proceed with the operation as planned.     Description of procedure: After informed consent was obtained from the patient, the patient was taken to the operating room. General anesthesia was administered. The patient was placed in dorsal lithotomy position and prepped and draped in usual sterile fashion. Sequential compression devices were applied to lower extremities at the beginning of the case for DVT prophylaxis. Antibiotics were infused prior to surgery start time. A surgical time-out was performed to properly identify the patient, the surgery to be performed, and the surgical site.     We then passed the 21-French rigid cystoscope down the urethra and into the bladder under direct vision without any difficulty. The anterior urethral was normal. The prostate was non-obstructing. The bladder was inspected with 30 and 70 degree lenses. Once in the bladder, systematic evaluation of bladder revealed erythematous areas as noted above - one on the left lateral wall and another on the bladder dome. The ureteral orfices were in orthotopic position and not involved.   We then removed the cystoscope and then passed down the 26 French resectoscope sheath down the urethra into the bladder under direct vision with the visual obturator. The  tumor was resected down to muscle. The TUR bladder tumor chips were retrieved from the bladder and each region of resection was passed off the field as a separate specimen.  Hemostasis was achieved using electrocautery. We then proceeded with removing the resectoscope and then placed in a 18 Fr Foley catheter.  The patient tolerated the procedure well with no complication and was awoken from anesthesia and taken to recovery in stable condition.     Plan:  Leave foley in for 3 days. F/u with me next week to review pathology results.  Matt R. [facility]  Pager: [phone]

## 2022-04-03 NOTE — Transfer of Care (Signed)
Immediate Anesthesia Transfer of Care Note  Patient: James Larsen  Procedure(s) Performed: CYSTOSCOPY WITH BLADDER BIOPSY/ TRANSURETHRAL RESECTION OF BLADDER TUMOR  Patient Location: PACU  Anesthesia Type:General  Level of Consciousness: drowsy  Airway & Oxygen Therapy: Patient Spontanous Breathing and Patient connected to face mask oxygen  Post-op Assessment: Report given to RN and Post -op Vital signs reviewed and stable  Post vital signs: Reviewed and stable  Last Vitals:  Vitals Value Taken Time  BP 157/126 04/03/22 1448  Temp    Pulse 71 04/03/22 1449  Resp 17 04/03/22 1449  SpO2 100 % 04/03/22 1449  Vitals shown include unvalidated device data.  Last Pain:  Vitals:   04/03/22 1142  TempSrc:   PainSc: 0-No pain         Complications: No notable events documented.

## 2022-04-03 NOTE — H&P (Signed)
CC/HPI: James Larsen is a 51 year old male seen in followup with urothelial CIS, recurrent urolithiasis, renal cysts and LUTS.   1. Urothelial CIS:  -Patient presented in 08/2021 with a 26-monthhistory of progressively worsening lower urinary tract symptoms. He stated that approximately 6 months prior, he developed signs and symptoms of urinary tract infection. He had some ciprofloxacin available to him so he took this with improved symptoms. At the time, he did state that he had a negative urinalysis.  -His most bothersome complaints were urinary frequency and urgency. He does state that he has bladder pain/pressure with bladder filling that improves with bladder emptying.  -He was prescribed Jalyn (dutasteride/tamsulosin) by another provider in early 2023 that did not improve his symptoms.   BPH evaluation 09/27/2021:  -TRUS volume 26.7 mL  -Cystoscopy with nonobstructing prostate. There was an approximately 1 cm area of erythema in the posterior left wall that appears suspicious for CIS. There were no papillary lesions identified.  -Uroflow with peak flow rate 8.2 cc/second and average flow rate 4.9 cc/second.  -PVR 0 mL 6   He is s/p bladder biopsy on 11/21/2021. Pathology resulted urothelial carcinoma in situ.  -CT hematuria protocol 12/01/2021 NED  -He completed BCG induction 6/6 cycles in 01/2022. He had worsening irritative symptoms with BCG however was able to tolerate all cycles.   Cystoscopy 03/07/2022 with erythema and suspicious areas encompassing about 3 x 3 cm area over his left lateral wall. He had a smaller area about 2 x 1 cm on his dome suspicious for recurrent CIS.   He denies gross hematuria. He has had some dysuria. He denies fevers or chills.   #2. Prostate cancer screening: PSA in 08/18/2021 was 0.61. He denies a family history of prostate cancer.   #3. Urolithiasis:  -CT A/P 04/22/2020 revealed a 1 mm calculus in the distal right ureter near the UVJ with moderate  hydronephrosis. No intrarenal calculus identified. He was able to pass the stone. He has had no stone episodes since this. Follow-up renal ultrasound on 06/09/2020 demonstrated resolved hydronephrosis.   He underwent metabolic evaluation. 24-hour urine on 06/28/2020 revealed borderline hyperoxaluria, borderline elevated urine pH and elevated uric acid. Counseled him to treat with a low oxalate diet, continue hydration to ensure adequate urine volume and reduce meat protein to treat his hyperuricosuria.   4. Simple renal cyst:  CT A/P 04/22/2020 also revealed right renal cyst measuring 2.8 cm in the right upper pole: Right interpolar cyst measuring 1.1 cm and right lower pole cyst measuring 2.8 cm with adjacent cystic 2.6 cm. There is also a left renal cyst measuring 1.2 cm in the left upper pole. He denies a family history of urologic malignancy.   He is from [place]     ALLERGIES: No Allergies    MEDICATIONS: Gemtesa 75 mg tablet 1 tablet PO Daily  Percocet 5 mg-325 mg tablet 1 tablet PO Q 4 H PRN  Amitriptyline Hcl 25 mg tablet Oral  Aspirin 81 MG TABS Oral  Losartan-Hydrochlorothiazide 50 mg-12.5 mg tablet Oral  Pyridium 100 mg tablet 1 tablet PO TID PRN  Pyridium 100 mg tablet 1 tablet PO TID PRN  Trospium Chloride Er 60 mg capsule, ext release 24 hr 1 capsule PO Daily     GU PSH: Bladder Instill AntiCA Agent - 01/23/2022, 01/10/2022, 01/03/2022, 12/20/2021, 12/13/2021, 12/06/2021 Complex Uroflow - 09/27/2021 Cystoscopy - 09/27/2021 Locm 300-'399Mg'$ /Ml Iodine,1Ml - 12/01/2021       PSH Notes: No Surgical Problems   NON-GU  PSH: None   GU PMH: CIS of the bladder - 02/06/2022, - 01/23/2022, - 01/17/2022, - 01/10/2022, - 01/03/2022, - 12/27/2021, - 12/20/2021, - 12/13/2021, - 12/01/2021, - 11/28/2021 Dysuria - 02/06/2022 Suprapubic pain - 02/06/2022, - 09/27/2021 Urinary Frequency - 01/17/2022, - 11/28/2021, - 09/27/2021, - 08/18/2021 Gross hematuria - 12/27/2021 BPH w/LUTS - 12/06/2021, - 09/27/2021, -  08/18/2021 Encounter for Prostate Cancer screening - 12/06/2021, - 11/28/2021, - 09/27/2021, - 08/18/2021 Renal cyst - 11/28/2021, - 09/27/2021, - 08/18/2021, - 09/14/2020, - 06/02/2020, Renal cyst, acquired, right, - 2014 Urinary Urgency - 11/28/2021, - 08/18/2021 History of urolithiasis - 09/27/2021, - 08/18/2021, - 09/14/2020, - 06/09/2020, - 06/02/2020, Nephrolithiasis, - 2014 Chronic prostatitis - 08/18/2021 LLQ pain, Abdominal Pain In The Left Lower Belly (LLQ) - 2014 Other microscopic hematuria, Microscopic Hematuria - 2014 Renal calculus, Nephrolithiasis - 2014 Ureteral calculus, Ureteral Stone - 2014    NON-GU PMH: Personal history of other diseases of the circulatory system, History of hypertension - 2014 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, Hepatitis, C Virus - 2014 Encounter for general adult medical examination without abnormal findings, Encounter for preventive health examination    FAMILY HISTORY: Acute Myocardial Infarction - Mother Death In The Family Father - Runs In Family Death In The Family Mother - Runs In Family Family Health Status Number - Runs In Family   SOCIAL HISTORY: Marital Status: Married     Notes: Never A Smoker   REVIEW OF SYSTEMS:    GU Review Male:   Patient denies frequent urination, hard to postpone urination, burning/ pain with urination, get up at night to urinate, leakage of urine, stream starts and stops, trouble starting your stream, have to strain to urinate , erection problems, and penile pain.  Gastrointestinal (Upper):   Patient denies nausea, vomiting, and indigestion/ heartburn.  Gastrointestinal (Lower):   Patient denies diarrhea and constipation.  Constitutional:   Patient denies fever, night sweats, weight loss, and fatigue.  Skin:   Patient denies skin rash/ lesion and itching.  Eyes:   Patient denies blurred vision and double vision.  Ears/ Nose/ Throat:   Patient denies sore throat and sinus problems.  Hematologic/Lymphatic:   Patient  denies swollen glands and easy bruising.  Cardiovascular:   Patient denies leg swelling and chest pains.  Respiratory:   Patient denies cough and shortness of breath.  Endocrine:   Patient denies excessive thirst.  Musculoskeletal:   Patient denies back pain and joint pain.  Neurological:   Patient denies headaches and dizziness.  Psychologic:   Patient denies depression and anxiety.   VITAL SIGNS: None   MULTI-SYSTEM PHYSICAL EXAMINATION:    Constitutional: Well-nourished. No physical deformities. Normally developed. Good grooming.  Respiratory: No labored breathing, no use of accessory muscles.   Cardiovascular: Normal temperature, normal extremity pulses, no swelling, no varicosities.  Gastrointestinal: No mass, no tenderness, no rigidity, non obese abdomen.     Complexity of Data:  Source Of History:  Patient, Medical Record Summary  Lab Test Review:   PSA  Records Review:   AUA Symptom Score, Previous Doctor Records  Urine Test Review:   Urinalysis  X-Ray Review: C.T. Abdomen/Pelvis: Reviewed Films. Reviewed Report. Discussed With Patient.     08/18/21  PSA  Total PSA 0.61 ng/mL    PROCEDURES:         Flexible Cystoscopy - 52000  Risks, benefits, and some of the potential complications of the procedure were discussed at length with the patient including infection, bleeding, voiding discomfort, urinary  retention, fever, chills, sepsis, and others. All questions were answered. Informed consent was obtained. Antibiotic prophylaxis was given. Sterile technique and intraurethral analgesia were used.  Meatus:  Normal size. Normal location. Normal condition.  Urethra:  No strictures.  External Sphincter:  Normal.  Verumontanum:  Normal.  Prostate:  Non-obstructing. No hyperplasia.  Bladder Neck:  Non-obstructing.  Ureteral Orifices:  Normal location. Normal size. Normal shape. Effluxed clear urine.  Bladder:  3 x 3 cm area of erythema on the left lateral wall suspicious for  recurrent CIS. 2 x 1 cm area of erythema suspicious of CIS on the dome of his bladder. No papillary lesions identified.      The lower urinary tract was carefully examined. The procedure was well-tolerated and without complications. Antibiotic instructions were given. Instructions were given to call the office immediately for bloody urine, difficulty urinating, urinary retention, painful or frequent urination, fever, chills, nausea, vomiting or other illness. The patient stated that he understood these instructions and would comply with them.         Urinalysis w/Scope Dipstick Dipstick Cont'd Micro  Color: Yellow Bilirubin: Neg mg/dL WBC/hpf: 6 - 10/hpf  Appearance: Clear Ketones: Neg mg/dL RBC/hpf: 3 - 10/hpf  Specific Gravity: 1.025 Blood: 1+ ery/uL Bacteria: Rare (0-9/hpf)  pH: <=5.0 Protein: Neg mg/dL Cystals: NS (Not Seen)  Glucose: Neg mg/dL Urobilinogen: 0.2 mg/dL Casts: NS (Not Seen)    Nitrites: Neg Trichomonas: Not Present    Leukocyte Esterase: Neg leu/uL Mucous: Present      Epithelial Cells: 0 - 5/hpf      Yeast: NS (Not Seen)      Sperm: Not Present    ASSESSMENT:      ICD-10 Details  1 GU:   CIS of the bladder - D09.0   2   Urinary Urgency - R39.15    PLAN:           Orders Labs Urine Cytology          Document Letter(s):  Created for Patient: Clinical Summary         Notes:    #1. Urothelial CIS:  -Surveillance cystoscopy today with suspicious areas concerning for recurrent CIS. I recommend cystoscopy, bladder biopsy, transurethral resection of bladder tumor. Discussed risk and benefits. Surgery letter sent.   #2. Prostate cancer screening: PSA 08/2021 was normal at 0.61. DRE 45 g, no nodules.   3. Recurrent nephrolithiasis:  -Recent CT A/P 12/2021 with no evidence of urolithiasis.   4. Bilateral renal cysts: Seen on CT A/P 04/22/2020. These are simple appearing cysts without a concern for malignancy. Followup renal ultrasound 06/09/2020 with stable appearing  cysts.   CC: Nolene Ebbs, MD   Urology Preoperative H&P   Chief Complaint: CIS of bladder  History of Present Illness: James Larsen is a 51 y.o. male with CIS of bladder. He underwent TURBT and BCG induction. Surveillance cystoscopy with lesions on the left lateral wall and dome concerning for recurrent CIS. He is here today for bladder biopsy, TURBT.    Past Medical History:  Diagnosis Date   Cancer [facility])    CIS of badder   Dysuria    Hepatitis B    remote history   History of COVID-19 2021   per pt mild symptoms that resovled   History of DVT of lower extremity 11/29/2014   RLE w/ occluded peroneal vein,  completed eliquis   History of kidney stones    History of malaria 2016   malaria [place]  History of pulmonary embolism 11/29/2014   RLL segmental branch,  completed eliquis  (at same time DVT);    (11-17-2021  per pt had negative blood work up for disorders,  stated never had clots prior to this episode and none since)   Hypertension    followed by pcp   (nuclear stress test in epic 04-25-2014 low risk no ischemia, ef 59%)   Lesion of bladder    Nocturia more than twice per night    Urgency of urination    Vitreomacular traction syndrome of right eye    per pt no vision loss   Wears glasses     Past Surgical History:  Procedure Laterality Date   CYSTOSCOPY WITH BIOPSY N/A 11/21/2021   Procedure: CYSTOSCOPY WITH BIOPSY/ FULGURATION/ LOW PRESSURE HYDRODISTENSION;  Surgeon: Janith Lima, MD;  Location: [facility];  Service: Urology;  Laterality: N/A;  ONLY NEEDS 30 MIN    Allergies: No Known Allergies  Family History  Problem Relation Age of Onset   Hypertension Mother     Social History:  reports that he has never smoked. He has never used smokeless tobacco. He reports that he does not currently use alcohol. He reports that he does not use drugs.  ROS: A complete review of systems was performed.  All systems are negative except for  pertinent findings as noted.  Physical Exam:  Vital signs in last 24 hours:   Constitutional:  Alert and oriented, No acute distress Cardiovascular: Regular rate and rhythm Respiratory: Normal respiratory effort, Lungs clear bilaterally GI: Abdomen is soft, nontender, nondistended, no abdominal masses GU: No CVA tenderness Lymphatic: No lymphadenopathy Neurologic: Grossly intact, no focal deficits Psychiatric: Normal mood and affect  Laboratory Data:  No results for input(s): "WBC", "HGB", "HCT", "PLT" in the last 72 hours.  No results for input(s): "NA", "K", "CL", "GLUCOSE", "BUN", "CALCIUM", "CREATININE" in the last 72 hours.  Invalid input(s): "CO3"   No results found for this or any previous visit (from the past 24 hour(s)). No results found for this or any previous visit (from the past 240 hour(s)).  Renal Function: No results for input(s): "CREATININE" in the last 168 hours. CrCl cannot be calculated (Unknown ideal weight.).  Radiologic Imaging: No results found.  I independently reviewed the above imaging studies.  Assessment and Plan Gina Costilla is a 51 y.o. male with bladder CIS here for bladder biopsy, TURBT.  Risks and benefits of Transurethral Resection of Bladder Tumor were reviewed in detail including infection, bleeding, blood transfusion, injury to bladder/urethra/surrounding structures, bladder perforation, obstructive and irritative voiding symptoms, and global anesthesia risks including but not limited to CVA, MI, DVT, PE, pneumonia, and death. Patient expressed understanding and desire to proceed.    Matt R. Ceci Taliaferro MD 04/03/2022, 11:08 AM  [facility] Pager: [phone]

## 2022-04-03 NOTE — Anesthesia Procedure Notes (Addendum)
Procedure Name: Intubation Date/Time: 04/03/2022 1:54 PM  Performed by: Nolon Nations, MDPre-anesthesia Checklist: Patient identified, Emergency Drugs available, Suction available and Patient being monitored Patient Re-evaluated:Patient Re-evaluated prior to induction Oxygen Delivery Method: Circle system utilized Preoxygenation: Pre-oxygenation with 100% oxygen Induction Type: IV induction Ventilation: Mask ventilation without difficulty Laryngoscope Size: Glidescope and 4 Grade View: Grade I Tube type: Parker flex tip Tube size: 7.5 mm Number of attempts: 1 Airway Equipment and Method: Stylet and Video-laryngoscopy Placement Confirmation: ETT inserted through vocal cords under direct vision, positive ETCO2 and breath sounds checked- equal and bilateral Secured at: 22 cm Tube secured with: Tape Dental Injury: Teeth and Oropharynx as per pre-operative assessment  Comments: Glidescope used per Anesthesiologist request.

## 2022-04-03 NOTE — Anesthesia Preprocedure Evaluation (Addendum)
Anesthesia Evaluation  Patient identified by MRN, date of birth, ID band Patient awake    Reviewed: Allergy & Precautions, NPO status , Patient's Chart, lab work & pertinent test results  History of Anesthesia Complications Negative for: history of anesthetic complications  Airway Mallampati: II  TM Distance: >3 FB Neck ROM: Full    Dental  (+) Dental Advisory Given, Teeth Intact   Pulmonary asthma , PE (2016, workup negative, completed eliquis)   Pulmonary exam normal breath sounds clear to auscultation       Cardiovascular hypertension, Pt. on medications + DVT  Normal cardiovascular exam Rhythm:Regular Rate:Normal     Neuro/Psych negative neurological ROS  negative psych ROS   GI/Hepatic negative GI ROS, (+) Hepatitis -  Endo/Other  negative endocrine ROS  Renal/GU negative Renal ROS     Musculoskeletal negative musculoskeletal ROS (+)   Abdominal (+) + obese,   Peds  Hematology negative hematology ROS (+)   Anesthesia Other Findings Bladder lesion  Reproductive/Obstetrics                          Anesthesia Physical  Anesthesia Plan  ASA: 3  Anesthesia Plan: General   Post-op Pain Management: Tylenol PO (pre-op)*   Induction: Intravenous  PONV Risk Score and Plan: 3 and Ondansetron, Dexamethasone, Midazolam and Treatment may vary due to age or medical condition  Airway Management Planned: Oral ETT  Additional Equipment: None  Intra-op Plan:   Post-operative Plan: Extubation in OR  Informed Consent: I have reviewed the patients History and Physical, chart, labs and discussed the procedure including the risks, benefits and alternatives for the proposed anesthesia with the patient or authorized representative who has indicated his/her understanding and acceptance.     Dental advisory given  Plan Discussed with: CRNA  Anesthesia Plan Comments:       Anesthesia  Quick Evaluation

## 2022-04-03 NOTE — Discharge Instructions (Addendum)
Activity:  You are encouraged to ambulate frequently (about every hour during waking hours) to help prevent blood clots from forming in your legs or lungs.    Diet: You should advance your diet as instructed by your physician.  It will be normal to have some bloating, nausea, and abdominal discomfort intermittently.  Prescriptions:  You will be provided a prescription for pain medication to take as needed.  If your pain is not severe enough to require the prescription pain medication, you may take extra strength Tylenol instead which will have less side effects.  You should also take a prescribed stool softener to avoid straining with bowel movements as the prescription pain medication may constipate you.  What to call us about: You should call the office [phone]) if you develop fever > 101 or develop persistent vomiting. Activity:  You are encouraged to ambulate frequently (about every hour during waking hours) to help prevent blood clots from forming in your legs or lungs.    You have a foley catheter in place. Remove foley in 3 days with provided syringe.

## 2022-04-04 ENCOUNTER — Encounter (HOSPITAL_COMMUNITY): Payer: Self-pay | Admitting: Urology

## 2022-04-04 NOTE — Anesthesia Postprocedure Evaluation (Signed)
Anesthesia Post Note  Patient: James Larsen  Procedure(s) Performed: CYSTOSCOPY WITH BLADDER BIOPSY/ TRANSURETHRAL RESECTION OF BLADDER TUMOR     Patient location during evaluation: PACU Anesthesia Type: General Level of consciousness: sedated and patient cooperative Pain management: pain level controlled Vital Signs Assessment: post-procedure vital signs reviewed and stable Respiratory status: spontaneous breathing Cardiovascular status: stable Anesthetic complications: no   No notable events documented.  Last Vitals:  Vitals:   04/03/22 1545 04/03/22 1600  BP: (!) 150/99 (!) 157/111  Pulse: 70 75  Resp: 14 14  Temp: 36.5 C 36.5 C  SpO2: 97% 98%    Last Pain:  Vitals:   04/03/22 1600  TempSrc:   PainSc: 0-No pain                 Nolon Nations

## 2022-04-05 LAB — SURGICAL PATHOLOGY

## 2022-04-10 DIAGNOSIS — D090 Carcinoma in situ of bladder: Secondary | ICD-10-CM | POA: Diagnosis not present

## 2022-04-13 DIAGNOSIS — C679 Malignant neoplasm of bladder, unspecified: Secondary | ICD-10-CM | POA: Diagnosis not present

## 2022-04-13 DIAGNOSIS — I10 Essential (primary) hypertension: Secondary | ICD-10-CM | POA: Diagnosis not present

## 2022-04-13 DIAGNOSIS — J4520 Mild intermittent asthma, uncomplicated: Secondary | ICD-10-CM | POA: Diagnosis not present

## 2022-04-27 DIAGNOSIS — I10 Essential (primary) hypertension: Secondary | ICD-10-CM | POA: Diagnosis not present

## 2022-04-27 DIAGNOSIS — J4520 Mild intermittent asthma, uncomplicated: Secondary | ICD-10-CM | POA: Diagnosis not present

## 2022-04-27 DIAGNOSIS — C679 Malignant neoplasm of bladder, unspecified: Secondary | ICD-10-CM | POA: Diagnosis not present

## 2022-05-15 DIAGNOSIS — Z5111 Encounter for antineoplastic chemotherapy: Secondary | ICD-10-CM | POA: Diagnosis not present

## 2022-05-15 DIAGNOSIS — D090 Carcinoma in situ of bladder: Secondary | ICD-10-CM | POA: Diagnosis not present

## 2022-05-22 DIAGNOSIS — Z5111 Encounter for antineoplastic chemotherapy: Secondary | ICD-10-CM | POA: Diagnosis not present

## 2022-05-22 DIAGNOSIS — D090 Carcinoma in situ of bladder: Secondary | ICD-10-CM | POA: Diagnosis not present

## 2022-05-26 DIAGNOSIS — R8271 Bacteriuria: Secondary | ICD-10-CM | POA: Diagnosis not present

## 2022-06-13 DIAGNOSIS — Z87442 Personal history of urinary calculi: Secondary | ICD-10-CM | POA: Diagnosis not present

## 2022-06-13 DIAGNOSIS — R3915 Urgency of urination: Secondary | ICD-10-CM | POA: Diagnosis not present

## 2022-06-13 DIAGNOSIS — D090 Carcinoma in situ of bladder: Secondary | ICD-10-CM | POA: Diagnosis not present

## 2022-06-13 DIAGNOSIS — R300 Dysuria: Secondary | ICD-10-CM | POA: Diagnosis not present

## 2022-06-23 DIAGNOSIS — D090 Carcinoma in situ of bladder: Secondary | ICD-10-CM | POA: Diagnosis not present

## 2022-06-23 DIAGNOSIS — Z5111 Encounter for antineoplastic chemotherapy: Secondary | ICD-10-CM | POA: Diagnosis not present

## 2022-07-03 DIAGNOSIS — G894 Chronic pain syndrome: Secondary | ICD-10-CM | POA: Diagnosis not present

## 2022-07-03 DIAGNOSIS — D090 Carcinoma in situ of bladder: Secondary | ICD-10-CM | POA: Diagnosis not present

## 2022-07-03 DIAGNOSIS — I10 Essential (primary) hypertension: Secondary | ICD-10-CM | POA: Diagnosis not present

## 2022-07-03 DIAGNOSIS — N369 Urethral disorder, unspecified: Secondary | ICD-10-CM | POA: Diagnosis not present

## 2022-07-04 DIAGNOSIS — R300 Dysuria: Secondary | ICD-10-CM | POA: Diagnosis not present

## 2022-07-04 DIAGNOSIS — D090 Carcinoma in situ of bladder: Secondary | ICD-10-CM | POA: Diagnosis not present

## 2022-07-04 DIAGNOSIS — Z87442 Personal history of urinary calculi: Secondary | ICD-10-CM | POA: Diagnosis not present

## 2022-07-04 DIAGNOSIS — R82998 Other abnormal findings in urine: Secondary | ICD-10-CM | POA: Diagnosis not present

## 2022-07-26 ENCOUNTER — Other Ambulatory Visit: Payer: Self-pay | Admitting: Urology

## 2022-07-26 ENCOUNTER — Encounter (HOSPITAL_BASED_OUTPATIENT_CLINIC_OR_DEPARTMENT_OTHER): Payer: Self-pay | Admitting: Urology

## 2022-07-27 ENCOUNTER — Encounter (HOSPITAL_BASED_OUTPATIENT_CLINIC_OR_DEPARTMENT_OTHER): Payer: Self-pay | Admitting: Urology

## 2022-07-27 NOTE — Progress Notes (Signed)
Spoke w/ via phone for pre-op interview---  pt Lab needs dos---- [facility], ekg              Lab results------ no COVID test -----patient states asymptomatic no test needed Arrive at ------- 0530 on 07-28-2022 NPO after MN NO Solid Food.  Clear liquids from MN until--- 0430 Med rec completed Medications to take morning of surgery ----- norvasc, myrbetriq Diabetic medication ----- n/a Patient instructed no nail polish to be worn day of surgery Patient instructed to bring photo id and insurance card day of surgery Patient aware to have Driver (ride ) / caregiver for 24 hours after surgery --- wife, caroline Patient Special Instructions ----- n/a Pre-Op special Istructions ----- n/a Patient verbalized understanding of instructions that were given at this phone interview. Patient denies shortness of breath, chest pain, fever, cough at this phone interview.

## 2022-07-27 NOTE — Anesthesia Preprocedure Evaluation (Addendum)
Anesthesia Evaluation  Patient identified by MRN, date of birth, ID band Patient awake    Reviewed: Allergy & Precautions, NPO status , Patient's Chart, lab work & pertinent test results  Airway Mallampati: III  TM Distance: >3 FB Neck ROM: Full    Dental  (+) Dental Advisory Given   Pulmonary asthma , PE (2016)   Pulmonary exam normal breath sounds clear to auscultation       Cardiovascular hypertension, Pt. on medications + DVT  Normal cardiovascular exam Rhythm:Regular Rate:Normal     Neuro/Psych negative neurological ROS  negative psych ROS   GI/Hepatic negative GI ROS, Neg liver ROS,,,  Endo/Other  negative endocrine ROS    Renal/GU negative Renal ROS Bladder dysfunction (bladder ca)      Musculoskeletal negative musculoskeletal ROS (+)    Abdominal   Peds negative pediatric ROS (+)  Hematology negative hematology ROS (+)   Anesthesia Other Findings   Reproductive/Obstetrics (+) Pregnancy                             Anesthesia Physical Anesthesia Plan  ASA: 2  Anesthesia Plan: General   Post-op Pain Management: Tylenol PO (pre-op)*   Induction: Intravenous  PONV Risk Score and Plan: 3 and Ondansetron, Dexamethasone, Midazolam and Treatment may vary due to age or medical condition  Airway Management Planned: Oral ETT and Video Laryngoscope Planned  Additional Equipment: None  Intra-op Plan:   Post-operative Plan: Extubation in OR  Informed Consent: I have reviewed the patients History and Physical, chart, labs and discussed the procedure including the risks, benefits and alternatives for the proposed anesthesia with the patient or authorized representative who has indicated his/her understanding and acceptance.     Dental advisory given  Plan Discussed with: CRNA  Anesthesia Plan Comments: (Last airway: Ventilation: Mask ventilation without  difficulty Laryngoscope Size: Glidescope and 4 Grade View: Grade I Tube type: Parker flex tip Tube size: 7.5 mm Number of attempts: 1 )        Anesthesia Quick Evaluation

## 2022-07-28 ENCOUNTER — Ambulatory Visit (HOSPITAL_BASED_OUTPATIENT_CLINIC_OR_DEPARTMENT_OTHER): Payer: BC Managed Care – PPO | Admitting: Anesthesiology

## 2022-07-28 ENCOUNTER — Other Ambulatory Visit: Payer: Self-pay

## 2022-07-28 ENCOUNTER — Encounter (HOSPITAL_BASED_OUTPATIENT_CLINIC_OR_DEPARTMENT_OTHER)
Admission: RE | Disposition: A | Discharge status: Home / Self Care | Payer: Self-pay | Source: Home / Self Care | Attending: Urology

## 2022-07-28 ENCOUNTER — Ambulatory Visit (HOSPITAL_BASED_OUTPATIENT_CLINIC_OR_DEPARTMENT_OTHER)
Admission: RE | Admit: 2022-07-28 | Discharge: 2022-07-28 | Disposition: A | Discharge status: Home / Self Care | Payer: BC Managed Care – PPO | Attending: Urology | Admitting: Urology

## 2022-07-28 ENCOUNTER — Encounter (HOSPITAL_BASED_OUTPATIENT_CLINIC_OR_DEPARTMENT_OTHER): Payer: Self-pay | Admitting: Urology

## 2022-07-28 DIAGNOSIS — N3000 Acute cystitis without hematuria: Secondary | ICD-10-CM | POA: Diagnosis not present

## 2022-07-28 DIAGNOSIS — I10 Essential (primary) hypertension: Secondary | ICD-10-CM | POA: Diagnosis not present

## 2022-07-28 DIAGNOSIS — N3289 Other specified disorders of bladder: Secondary | ICD-10-CM | POA: Diagnosis not present

## 2022-07-28 DIAGNOSIS — R300 Dysuria: Secondary | ICD-10-CM | POA: Insufficient documentation

## 2022-07-28 DIAGNOSIS — Z87442 Personal history of urinary calculi: Secondary | ICD-10-CM | POA: Insufficient documentation

## 2022-07-28 DIAGNOSIS — N3020 Other chronic cystitis without hematuria: Secondary | ICD-10-CM | POA: Diagnosis not present

## 2022-07-28 DIAGNOSIS — Z86718 Personal history of other venous thrombosis and embolism: Secondary | ICD-10-CM | POA: Diagnosis not present

## 2022-07-28 DIAGNOSIS — Z79899 Other long term (current) drug therapy: Secondary | ICD-10-CM | POA: Diagnosis not present

## 2022-07-28 DIAGNOSIS — Z01818 Encounter for other preprocedural examination: Secondary | ICD-10-CM

## 2022-07-28 DIAGNOSIS — Z86711 Personal history of pulmonary embolism: Secondary | ICD-10-CM | POA: Insufficient documentation

## 2022-07-28 DIAGNOSIS — D090 Carcinoma in situ of bladder: Secondary | ICD-10-CM | POA: Insufficient documentation

## 2022-07-28 DIAGNOSIS — Z8551 Personal history of malignant neoplasm of bladder: Secondary | ICD-10-CM | POA: Diagnosis not present

## 2022-07-28 DIAGNOSIS — N281 Cyst of kidney, acquired: Secondary | ICD-10-CM | POA: Diagnosis not present

## 2022-07-28 HISTORY — PX: TRANSURETHRAL RESECTION OF BLADDER TUMOR: SHX2575

## 2022-07-28 HISTORY — DX: Personal history of other infectious and parasitic diseases: Z86.19

## 2022-07-28 HISTORY — PX: CYSTOSCOPY: SHX5120

## 2022-07-28 LAB — POCT I-STAT, CHEM 8
BUN: 11 mg/dL (ref 6–20)
Calcium, Ion: 1.23 mmol/L (ref 1.15–1.40)
Chloride: 106 mmol/L (ref 98–111)
Creatinine, Ser: 1 mg/dL (ref 0.61–1.24)
Glucose, Bld: 95 mg/dL (ref 70–99)
HCT: 46 % (ref 39.0–52.0)
Hemoglobin: 15.6 g/dL (ref 13.0–17.0)
Potassium: 3.5 mmol/L (ref 3.5–5.1)
Sodium: 143 mmol/L (ref 135–145)
TCO2: 24 mmol/L (ref 22–32)

## 2022-07-28 SURGERY — CYSTOSCOPY
Anesthesia: General | Site: Ureter

## 2022-07-28 MED ORDER — DEXAMETHASONE SODIUM PHOSPHATE 4 MG/ML IJ SOLN
INTRAMUSCULAR | Status: DC | PRN
  Administered 2022-07-28: 8 mg via INTRAVENOUS

## 2022-07-28 MED ORDER — FENTANYL CITRATE (PF) 100 MCG/2ML IJ SOLN
INTRAMUSCULAR | Status: AC
  Filled 2022-07-28: qty 2

## 2022-07-28 MED ORDER — ONDANSETRON HCL 4 MG/2ML IJ SOLN: 4.0000 mg | Freq: Once | INTRAMUSCULAR | Status: DC | PRN

## 2022-07-28 MED ORDER — STERILE WATER FOR IRRIGATION IR SOLN
Status: DC | PRN
  Administered 2022-07-28 (×3): 3000 mL

## 2022-07-28 MED ORDER — ROCURONIUM BROMIDE 100 MG/10ML IV SOLN
INTRAVENOUS | Status: DC | PRN
  Administered 2022-07-28: 100 mg via INTRAVENOUS

## 2022-07-28 MED ORDER — MIDAZOLAM HCL 2 MG/2ML IJ SOLN
INTRAMUSCULAR | Status: DC | PRN
  Administered 2022-07-28: 2 mg via INTRAVENOUS

## 2022-07-28 MED ORDER — FENTANYL CITRATE (PF) 100 MCG/2ML IJ SOLN
INTRAMUSCULAR | Status: DC | PRN
  Administered 2022-07-28: 50 ug via INTRAVENOUS
  Administered 2022-07-28: 25 ug via INTRAVENOUS
  Administered 2022-07-28: 50 ug via INTRAVENOUS

## 2022-07-28 MED ORDER — CEFAZOLIN SODIUM-DEXTROSE 2-4 GM/100ML-% IV SOLN
INTRAVENOUS | Status: AC
  Filled 2022-07-28: qty 100

## 2022-07-28 MED ORDER — OXYCODONE HCL 5 MG/5ML PO SOLN: 5.0000 mg | Freq: Once | ORAL | Status: DC | PRN

## 2022-07-28 MED ORDER — ONDANSETRON HCL 4 MG/2ML IJ SOLN
INTRAMUSCULAR | Status: DC | PRN
  Administered 2022-07-28: 4 mg via INTRAVENOUS

## 2022-07-28 MED ORDER — MIDAZOLAM HCL 2 MG/2ML IJ SOLN
INTRAMUSCULAR | Status: AC
  Filled 2022-07-28: qty 2

## 2022-07-28 MED ORDER — OXYCODONE HCL 5 MG PO TABS: 5.0000 mg | ORAL_TABLET | Freq: Once | ORAL | Status: DC | PRN

## 2022-07-28 MED ORDER — AMISULPRIDE (ANTIEMETIC) 5 MG/2ML IV SOLN: 10.0000 mg | Freq: Once | INTRAVENOUS | Status: DC | PRN

## 2022-07-28 MED ORDER — LACTATED RINGERS IV SOLN: INTRAVENOUS | Status: DC

## 2022-07-28 MED ORDER — PROPOFOL 10 MG/ML IV BOLUS
INTRAVENOUS | Status: DC | PRN
  Administered 2022-07-28: 200 mg via INTRAVENOUS

## 2022-07-28 MED ORDER — OXYCODONE HCL 5 MG PO TABS: 5.0000 mg | ORAL_TABLET | Freq: Two times a day (BID) | ORAL | 0 refills | Status: AC

## 2022-07-28 MED ORDER — DOCUSATE SODIUM 100 MG PO CAPS: 100.0000 mg | ORAL_CAPSULE | Freq: Every day | ORAL | 0 refills | Status: AC | PRN

## 2022-07-28 MED ORDER — ACETAMINOPHEN 500 MG PO TABS
ORAL_TABLET | ORAL | Status: AC
  Filled 2022-07-28: qty 2

## 2022-07-28 MED ORDER — HYDROMORPHONE HCL 1 MG/ML IJ SOLN
INTRAMUSCULAR | Status: AC
  Filled 2022-07-28: qty 1

## 2022-07-28 MED ORDER — HYDROMORPHONE HCL 1 MG/ML IJ SOLN
0.2500 mg | INTRAMUSCULAR | Status: DC | PRN
  Administered 2022-07-28 (×2): 0.5 mg via INTRAVENOUS

## 2022-07-28 MED ORDER — SUGAMMADEX SODIUM 200 MG/2ML IV SOLN
INTRAVENOUS | Status: DC | PRN
  Administered 2022-07-28 (×2): 200 mg via INTRAVENOUS

## 2022-07-28 MED ORDER — LIDOCAINE HCL (CARDIAC) PF 100 MG/5ML IV SOSY
PREFILLED_SYRINGE | INTRAVENOUS | Status: DC | PRN
  Administered 2022-07-28: 60 mg via INTRAVENOUS

## 2022-07-28 MED ORDER — ACETAMINOPHEN 500 MG PO TABS
1000.0000 mg | ORAL_TABLET | Freq: Once | ORAL | Status: AC
  Administered 2022-07-28: 1000 mg via ORAL

## 2022-07-28 MED ORDER — CEFAZOLIN SODIUM-DEXTROSE 2-4 GM/100ML-% IV SOLN
2.0000 g | INTRAVENOUS | Status: AC
  Administered 2022-07-28: 2 g via INTRAVENOUS

## 2022-07-28 SURGICAL SUPPLY — 47 items
BAG DRAIN URO-CYSTO SKYTR STRL (DRAIN) ×1 IMPLANT
BAG DRN RND TRDRP ANRFLXCHMBR (UROLOGICAL SUPPLIES) ×1
BAG DRN UROCATH (DRAIN) ×1
BAG URINE DRAIN 2000ML AR STRL (UROLOGICAL SUPPLIES) ×1 IMPLANT
BAG URINE LEG 500ML (DRAIN) IMPLANT
BLADE SURG 15 STRL LF DISP TIS (BLADE) ×1 IMPLANT
BLADE SURG 15 STRL SS (BLADE)
CATH FOLEY 2WAY SLVR  5CC 16FR (CATHETERS)
CATH FOLEY 2WAY SLVR  5CC 18FR (CATHETERS) ×1
CATH FOLEY 2WAY SLVR  5CC 22FR (CATHETERS)
CATH FOLEY 2WAY SLVR 30CC 20FR (CATHETERS) IMPLANT
CATH FOLEY 2WAY SLVR 5CC 16FR (CATHETERS) IMPLANT
CATH FOLEY 2WAY SLVR 5CC 18FR (CATHETERS) IMPLANT
CATH FOLEY 2WAY SLVR 5CC 22FR (CATHETERS) IMPLANT
CATH FOLEY INTRO SUPRA 16F (CATHETERS) IMPLANT
CATH SET URETHRAL DILATOR (CATHETERS) IMPLANT
CATH URETL OPEN 5X70 (CATHETERS) IMPLANT
CLOTH BEACON ORANGE TIMEOUT ST (SAFETY) ×1 IMPLANT
COVER SURGICAL LIGHT HANDLE (MISCELLANEOUS) ×1 IMPLANT
DRSG TELFA 3X8 NADH STRL (GAUZE/BANDAGES/DRESSINGS) IMPLANT
ELECT REM PT RETURN 9FT ADLT (ELECTROSURGICAL) ×1
ELECTRODE REM PT RTRN 9FT ADLT (ELECTROSURGICAL) ×1 IMPLANT
EVACUATOR MICROVAS BLADDER (UROLOGICAL SUPPLIES) IMPLANT
GLOVE BIO SURGEON STRL SZ7 (GLOVE) ×2 IMPLANT
GLOVE BIO SURGEON STRL SZ7.5 (GLOVE) ×1 IMPLANT
GLOVE BIOGEL PI IND STRL 6 (GLOVE) IMPLANT
GLOVE ECLIPSE 6.0 STRL STRAW (GLOVE) IMPLANT
GOWN STRL REUS W/TWL LRG LVL3 (GOWN DISPOSABLE) ×1 IMPLANT
GUIDEWIRE STR DUAL SENSOR (WIRE) IMPLANT
GUIDEWIRE ZIPWRE .038 STRAIGHT (WIRE) IMPLANT
HOLDER FOLEY CATH W/STRAP (MISCELLANEOUS) ×1 IMPLANT
KIT TURNOVER CYSTO (KITS) ×1 IMPLANT
LOOP CUT BIPOLAR 24F LRG (ELECTROSURGICAL) IMPLANT
MANIFOLD NEPTUNE II (INSTRUMENTS) ×1 IMPLANT
NEEDLE HYPO 22GX1.5 SAFETY (NEEDLE) IMPLANT
PACK CYSTO (CUSTOM PROCEDURE TRAY) ×1 IMPLANT
PENCIL SMOKE EVACUATOR (MISCELLANEOUS) IMPLANT
SUT SILK 2 0 30  PSL (SUTURE)
SUT SILK 2 0 30 PSL (SUTURE) IMPLANT
SUT VIC AB 3-0 PS2 18 (SUTURE)
SUT VIC AB 3-0 PS2 18XBRD (SUTURE) ×1 IMPLANT
SYR TOOMEY IRRIG 70ML (MISCELLANEOUS) ×1
SYRINGE TOOMEY IRRIG 70ML (MISCELLANEOUS) ×1 IMPLANT
TOWEL OR 17X26 10 PK STRL BLUE (TOWEL DISPOSABLE) ×1 IMPLANT
TUBE CONNECTING 12X1/4 (SUCTIONS) IMPLANT
WATER STERILE IRR 3000ML UROMA (IV SOLUTION) ×1 IMPLANT
YANKAUER SUCT BULB TIP NO VENT (SUCTIONS) IMPLANT

## 2022-07-28 NOTE — Anesthesia Procedure Notes (Signed)
Procedure Name: Intubation Date/Time: 07/28/2022 7:37 AM  Performed by: Georgeanne Nim, CRNAPre-anesthesia Checklist: Patient identified, Emergency Drugs available, Suction available, Patient being monitored and Timeout performed Patient Re-evaluated:Patient Re-evaluated prior to induction Oxygen Delivery Method: Circle system utilized Preoxygenation: Pre-oxygenation with 100% oxygen Induction Type: IV induction Ventilation: Mask ventilation without difficulty Laryngoscope Size: Glidescope and 3 Grade View: Grade I Tube size: 7.0 mm Number of attempts: 1 Airway Equipment and Method: Stylet Placement Confirmation: ETT inserted through vocal cords under direct vision, positive ETCO2 and breath sounds checked- equal and bilateral Comments: Pt request to use glide

## 2022-07-28 NOTE — Discharge Instructions (Addendum)
Activity:  You are encouraged to ambulate frequently (about every hour during waking hours) to help prevent blood clots from forming in your legs or lungs.    Diet: You should advance your diet as instructed by your physician.  It will be normal to have some bloating, nausea, and abdominal discomfort intermittently.  Prescriptions:  You will be provided a prescription for pain medication to take as needed.  If your pain is not severe enough to require the prescription pain medication, you may take extra strength Tylenol instead which will have less side effects.  You should also take a prescribed stool softener to avoid straining with bowel movements as the prescription pain medication may constipate you.  What to call us about: You should call the office [phone]) if you develop fever > 101 or develop persistent vomiting. Activity:  You are encouraged to ambulate frequently (about every hour during waking hours) to help prevent blood clots from forming in your legs or lungs.    You have a foley catheter in place. Remove foley catheter in 3 days with provided syringe.   Post Anesthesia Home Care Instructions  Activity: Get plenty of rest for the remainder of the day. A responsible individual must stay with you for 24 hours following the procedure.  For the next 24 hours, DO NOT: -Drive a car -Paediatric nurse -Drink alcoholic beverages -Take any medication unless instructed by your physician -Make any legal decisions or sign important papers.  Meals: Start with liquid foods such as gelatin or soup. Progress to regular foods as tolerated. Avoid greasy, spicy, heavy foods. If nausea and/or vomiting occur, drink only clear liquids until the nausea and/or vomiting subsides. Call your physician if vomiting continues.  Special Instructions/Symptoms: Your throat may feel dry or sore from the anesthesia or the breathing tube placed in your throat during surgery. If this causes discomfort,  gargle with warm salt water. The discomfort should disappear within 24 hours.  May  take Tylenol beginning at 1230 PM as needed for pain/soreness.

## 2022-07-28 NOTE — Op Note (Signed)
Operative Note  Preoperative diagnosis:  1.  Urothelial carcinoma in situ of bladder  Postoperative diagnosis: 1.  Same  Procedure(s): 1.  TURBT small  Surgeon: Rexene Alberts, MD  Assistants:  None  Anesthesia:  General  Complications:  None  EBL:  Minimal  Specimens: 1.  ID Type Source Tests Collected by Time Destination  1 : left lateral wall bladder tumor Tissue PATH GU biopsy SURGICAL PATHOLOGY Janith Lima, MD 07/28/2022 [phone]   2 : posterior bladder wall Tissue PATH GU biopsy SURGICAL PATHOLOGY Janith Lima, MD 07/28/2022 [phone]    Drains/Catheters: 1.  18 Fr foley catheter  Intraoperative findings:   Small capacity bladder with friable left lateral wall and posterior bladder wall lesions with mild bleeding with bladder filling. 2x2cm area of erythema on the left lateral wall suspicious for CIS and 2cm area of erythema on the posterior bladder wall suspicious for CIS. These areas were widely resected and fulgurated. Excellent hemostasis.  Number of tumors:       2  Size of largest tumor:                  2 cm     Characteristics of tumors:     Flat   Recurrent   Yes     Primary   No   Suspicious for Carcinoma in situ:    Yes   Clinical tumor stage:         cTis      Bimanual exam under anesthesia:        Performed - No evidence of 3D mass   Visually complete resection:                 Yes   Visualization of detrusor muscle in resection base:        Yes    Visual evaluation for perforation:             Performed - no evidence of perforation   Indication:  James Larsen is a 51 y.o. 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 He had recurrent erythema and TURBT 04/03/2022 demonstrated no evidence of malignancy and rather chronic inflammation. Repeat cystoscopy demonstrated persistent erythema and edema with concern for persistent CIS and he is being brought back to the operating room for TURBT. All the  risks, benefits were discussed with the patient to include but not limited to infection, pain, bleeding, damage to adjacent structures, need for further operations, adverse reaction to anesthesia and death.  Patient understands these risks and agrees to proceed with the operation as planned.     Description of procedure: After informed consent was obtained from the patient, the patient was taken to the operating room. General anesthesia was administered. The patient was placed in dorsal lithotomy position and prepped and draped in usual sterile fashion. Sequential compression devices were applied to lower extremities at the beginning of the case for DVT prophylaxis. Antibiotics were infused prior to surgery start time. A surgical time-out was performed to properly identify the patient, the surgery to be performed, and the surgical site.     We then passed the 21-French rigid cystoscope down the urethra and into the bladder under direct vision without any difficulty. The anterior urethral was normal. The prostate was non-obstructing. The bladder was inspected with 30 and 70 degree lenses. Once in the bladder, systematic evaluation of bladder revealed a small capacity bladder with scarring present. There  was a 2x2cm area of erythema and edema on the left lateral wall and another 2cm area on the posterior bladder wall with erythema concerning for CIS. The ureteral orfices were in orthotopic position and not involved.   We then removed the cystoscope and then passed down the 26 French resectoscope sheath down the urethra into the bladder under direct vision with the visual obturator. The suspicious lesions were resected down to muscle. Of note, it was difficult to resect through the prior scar areas with the loop. The TUR bladder tumor chips were retrieved from the bladder and each region of resection was passed off the field as a separate specimen.  Hemostasis was achieved using electrocautery. We then proceeded  with removing the resectoscope and then placed in a 18 Foley catheter.  The patient tolerated the procedure well with no complication and was awoken from anesthesia and taken to recovery in stable condition.     Plan:  Discharge home with foley catheter in place for 3 days. He will remove this with provided syringe on Monday AM. He will f/u with me in the office to review pathology. I showed images to his wife post-operatively.  Matt R. [facility]  Pager: [phone]

## 2022-07-28 NOTE — Anesthesia Postprocedure Evaluation (Signed)
Anesthesia Post Note  Patient: Mearl Latin  Procedure(s) Performed: CYSTOSCOPY, BLADDER BIOIPSY (Ureter) TRANSURETHRAL RESECTION OF BLADDER TUMOR (TURBT) (Ureter)     Patient location during evaluation: PACU Anesthesia Type: General Level of consciousness: awake and alert, oriented and patient cooperative Pain management: pain level controlled Vital Signs Assessment: post-procedure vital signs reviewed and stable Respiratory status: spontaneous breathing, nonlabored ventilation and respiratory function stable Cardiovascular status: blood pressure returned to baseline and stable Postop Assessment: no apparent nausea or vomiting Anesthetic complications: no   No notable events documented.  Last Vitals:  Vitals:   07/28/22 0915 07/28/22 0945  BP: (!) 129/99 (!) 133/103  Pulse: (!) 59 (!) 52  Resp: 10 16  Temp:  36.7 C  SpO2: 92% 100%    Last Pain:  Vitals:   07/28/22 0609  TempSrc: Oral  PainSc: [place]

## 2022-07-28 NOTE — H&P (Signed)
Office Visit Report     07/04/2022    CC/HPI: James Larsen is a 51 year old male seen in followup with urothelial CIS, recurrent urolithiasis, renal cysts and LUTS.   1. Urothelial CIS:  -Patient presented in 08/2021 with a 19-monthhistory of progressively worsening lower urinary tract symptoms. He stated that approximately 6 months prior, he developed signs and symptoms of urinary tract infection. He had some ciprofloxacin available to him so he took this with improved symptoms. At the time, he did state that he had a negative urinalysis.  -His most bothersome complaints were urinary frequency and urgency. He does state that he has bladder pain/pressure with bladder filling that improves with bladder emptying.  -He was prescribed Jalyn (dutasteride/tamsulosin) by another provider in early 2023 that did not improve his symptoms.   BPH evaluation 09/27/2021:  -TRUS volume 26.7 mL  -Cystoscopy with nonobstructing prostate. There was an approximately 1 cm area of erythema in the posterior left wall that appeared suspicious for CIS. There were no papillary lesions identified.  -Uroflow with peak flow rate 8.2 cc/second and average flow rate 4.9 cc/second.  -PVR 0 mL   He is s/p bladder biopsy on 11/21/2021. Pathology resulted urothelial carcinoma in situ.  -CT hematuria protocol 12/01/2021 NED  -He completed BCG induction 6/6 cycles in 01/2022. He had worsening irritative symptoms with BCG however was able to tolerate all cycles.  -3 mo surveillance cystoscopy 03/07/2022 with erythema and suspicious areas encompassing about 3 x 3 cm area over his left lateral wall. He had a smaller area about 2 x 1 cm on his dome suspicious for recurrent CIS.  -S/p TURBT 04/03/2022: NED (submucosa with chronic inflammation, dystrophic calcification, stromal hemmorhage)  -He tolerated 2/3 cycles of his 312-monthCG maintenance in 05/2022.  -Surveillance cysto 06/2022 with erythema, edema over left lateral wall, unable to  rule out CIS.   He still has positive urination symptoms including dysuria, pelvic discomfort. He poorly tolerated his last BCG maintenance. He is now seeing a pain specialist.   #2. Prostate cancer screening: PSA in 08/18/2021 was 0.61. He denies a family history of prostate cancer.   #3. Urolithiasis:  -CT A/P 04/22/2020 revealed a 1 mm calculus in the distal right ureter near the UVJ with moderate hydronephrosis. No intrarenal calculus identified. He was able to pass the stone. He has had no stone episodes since this. Follow-up renal ultrasound on 06/09/2020 demonstrated resolved hydronephrosis.   He underwent metabolic evaluation. 24-hour urine on 06/28/2020 revealed borderline hyperoxaluria, borderline elevated urine pH and elevated uric acid. Counseled him to treat with a low oxalate diet, continue hydration to ensure adequate urine volume and reduce meat protein to treat his hyperuricosuria.   4. Simple renal cyst:  CT A/P 04/22/2020 also revealed right renal cyst measuring 2.8 cm in the right upper pole: Right interpolar cyst measuring 1.1 cm and right lower pole cyst measuring 2.8 cm with adjacent cystic 2.6 cm. There is also a left renal cyst measuring 1.2 cm in the left upper pole. He denies a family history of urologic malignancy.   [place] [employer]    ALLERGIES: No Allergies    MEDICATIONS: Gemtesa 75 mg tablet 1 tablet PO Daily  Myrbetriq 25 mg tablet, extended release 24 hr 1 tablet PO Daily  Percocet 5 mg-325 mg tablet 1 tablet PO Q 4 H PRN  Percocet 5 mg-325 mg tablet 1 tablet PO Q 4 H  PRN  Amitriptyline Hcl 25 mg tablet Oral  Aspirin 81 MG TABS Oral  Losartan-Hydrochlorothiazide 50 mg-12.5 mg tablet Oral  Pyridium 100 mg tablet 1 tablet PO TID PRN  Pyridium 100 mg tablet 1 tablet PO TID PRN  Trospium Chloride Er 60 mg capsule, ext release 24 hr 1 capsule PO Daily     GU PSH: Bladder Instill AntiCA Agent - 06/23/2022, 05/22/2022,  05/15/2022, 01/23/2022, 01/10/2022, 01/03/2022, 12/20/2021, 12/13/2021, 12/06/2021 Complex Uroflow - 09/27/2021 Cystoscopy - 03/07/2022, 09/27/2021 Cystoscopy TURBT <2 cm - 04/03/2022 Locm 300-'399Mg'$ /Ml Iodine,1Ml - 12/01/2021       PSH Notes: No Surgical Problems   NON-GU PSH: None   GU PMH: CIS of the bladder - 06/23/2022, - 06/13/2022, - 05/22/2022, - 05/15/2022, - 04/10/2022, - 03/07/2022, - 02/06/2022, - 01/23/2022, - 01/17/2022, - 01/10/2022, - 01/03/2022, - 12/27/2021, - 12/20/2021, - 12/13/2021, - 12/01/2021, - 11/28/2021 Dysuria - 06/13/2022, - 02/06/2022 Encounter for Prostate Cancer screening - 06/13/2022, - 04/10/2022, - 12/06/2021, - 11/28/2021, - 09/27/2021, - 08/18/2021 History of urolithiasis - 06/13/2022, - 04/10/2022, - 09/27/2021, - 08/18/2021, - 2022, - 2021, - 2021, Nephrolithiasis, - 2014 Urinary Urgency - 06/13/2022, - 03/07/2022, - 11/28/2021, - 08/18/2021 Suprapubic pain - 02/06/2022, - 09/27/2021 Urinary Frequency - 01/17/2022, - 11/28/2021, - 09/27/2021, - 08/18/2021 Gross hematuria - 12/27/2021 BPH w/LUTS - 12/06/2021, - 09/27/2021, - 08/18/2021 Renal cyst - 11/28/2021, - 09/27/2021, - 08/18/2021, - 2022, - 2021, Renal cyst, acquired, right, - 2014 Chronic prostatitis - 08/18/2021 LLQ pain, Abdominal Pain In The Left Lower Belly (LLQ) - 2014 Other microscopic hematuria, Microscopic Hematuria - 2014 Renal calculus, Nephrolithiasis - 2014 Ureteral calculus, Ureteral Stone - 2014    NON-GU PMH: Personal history of other diseases of the circulatory system, History of hypertension - 2014 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, Hepatitis, C Virus - 2014 Encounter for general adult medical examination without abnormal findings, Encounter for preventive health examination    FAMILY HISTORY: Acute Myocardial Infarction - Mother Death In The Family Father - Runs In Family Death In The Family Mother - Runs In Family Family Health Status Number - Runs In Family   SOCIAL HISTORY: Marital Status: Married     Notes:  Never A Smoker   REVIEW OF SYSTEMS:    GU Review Male:   Patient denies frequent urination, hard to postpone urination, burning/ pain with urination, get up at night to urinate, leakage of urine, stream starts and stops, trouble starting your stream, have to strain to urinate , erection problems, and penile pain.  Gastrointestinal (Upper):   Patient denies nausea, vomiting, and indigestion/ heartburn.  Gastrointestinal (Lower):   Patient denies diarrhea and constipation.  Constitutional:   Patient denies fever, night sweats, weight loss, and fatigue.  Skin:   Patient denies skin rash/ lesion and itching.  Eyes:   Patient denies blurred vision and double vision.  Ears/ Nose/ Throat:   Patient denies sore throat and sinus problems.  Hematologic/Lymphatic:   Patient denies swollen glands and easy bruising.  Cardiovascular:   Patient denies leg swelling and chest pains.  Respiratory:   Patient denies cough and shortness of breath.  Endocrine:   Patient denies excessive thirst.  Musculoskeletal:   Patient denies back pain and joint pain.  Neurological:   Patient denies headaches and dizziness.  Psychologic:   Patient denies depression and anxiety.   VITAL SIGNS: None   MULTI-SYSTEM PHYSICAL EXAMINATION:    Constitutional: Well-nourished. No physical deformities. Normally developed. Good grooming.  Respiratory: No labored  breathing, no use of accessory muscles.   Cardiovascular: Normal temperature, normal extremity pulses, no swelling, no varicosities.  Gastrointestinal: No mass, no tenderness, no rigidity, non obese abdomen.  Musculoskeletal: Normal gait and station of head and neck.     Complexity of Data:  Source Of History:  Patient, Medical Record Summary  Lab Test Review:   PSA  Records Review:   AUA Symptom Score, Pathology Reports, Previous Patient Records  Urine Test Review:   Urinalysis   08/18/21  PSA  Total PSA 0.61 ng/mL    PROCEDURES:         Flexible Cystoscopy - 52000   Risks, benefits, and some of the potential complications of the procedure were discussed at length with the patient including infection, bleeding, voiding discomfort, urinary retention, fever, chills, sepsis, and others. All questions were answered. Informed consent was obtained. Antibiotic prophylaxis was given. Sterile technique and intraurethral analgesia were used.  Meatus:  Normal size. Normal location. Normal condition.  Urethra:  No strictures.  External Sphincter:  Normal.  Verumontanum:  Normal.  Prostate:  Non-obstructing. No hyperplasia.  Bladder Neck:  Non-obstructing.  Ureteral Orifices:  Normal location. Normal size. Normal shape. Effluxed clear urine.  Bladder:  Small capacity. Evidence of erythema, edema and friable tissue on the left lateral wall. No papillary lesion. Unable to rule out possibility of CIS.      The lower urinary tract was carefully examined. The procedure was well-tolerated and without complications. Antibiotic instructions were given. Instructions were given to call the office immediately for bloody urine, difficulty urinating, urinary retention, painful or frequent urination, fever, chills, nausea, vomiting or other illness. The patient stated that he understood these instructions and would comply with them.         Urinalysis w/Scope Dipstick Dipstick Cont'd Micro  Color: Amber Bilirubin: Neg mg/dL WBC/hpf: >60/hpf  Appearance: Cloudy Ketones: Neg mg/dL RBC/hpf: Packed/hpf  Specific Gravity: 1.020 Blood: 3+ ery/uL Bacteria: Mod (26-50/hpf)  pH: 6.0 Protein: 2+ mg/dL Cystals: NS (Not Seen)  Glucose: Neg mg/dL Urobilinogen: 0.2 mg/dL Casts: NS (Not Seen)    Nitrites: Neg Trichomonas: Not Present    Leukocyte Esterase: 3+ leu/uL Mucous: Not Present      Epithelial Cells: 0 - 5/hpf      Yeast: NS (Not Seen)      Sperm: Not Present    ASSESSMENT:      ICD-10 Details  1 GU:   CIS of the bladder - D09.0   2   Dysuria - R30.0   3   History of  urolithiasis - Z87.442    PLAN:           Document Letter(s):  Created for Patient: Clinical Summary         Notes:    #1. Urothelial CIS:  -Dx 11/2021. S/p 6/6 cycles BCG. Repeat TURBT 02/2022 NED.  -Completed 2/3 cycles of maintenance BCG in 10/23. Unable to tolerate any further.  -Surveillance cystoscopy today with persistent erythema over the left lateral wall with edema in this area. No papillary lesions. Unable to rule out CIS. I recommend repeat bladder biopsy, TURBT. We will arrange for next available.  -We did discuss the role of pembrolizumab if evidence of recurrent cancer after failing a second BCG induction.  -Also discussed the role of cystectomy however these would be in patients that are BCG unresponsive.  -Continue Myrbetriq for urinary urgency.  -He is now following with pain specialist to help manage his discomfort.   #2. Prostate  cancer screening: PSA 08/2021 was normal at 0.61. DRE 45 g, no nodules.   3. Recurrent nephrolithiasis:  -Recent CT A/P 12/2021 with no evidence of urolithiasis.   4. Bilateral renal cysts: Seen on CT A/P 04/22/2020. These are simple appearing cysts without a concern for malignancy. Followup renal ultrasound 06/09/2020 with stable appearing cysts.   CC: Nolene Ebbs, MD   Urology Preoperative H&P   Chief Complaint: History of bladder CIS with recurrent bladder lesion  History of Present Illness: James Larsen is a 51 y.o. male with history of bladder CIS with recurrent bladder lesion here for cysto, bladder biopsy, TURBT. Denies fevers, chills, dysuria.    Past Medical History:  Diagnosis Date   Carcinoma in situ of bladder 11/2021   urologist--- dr Laney Bagshaw;    s/p TURBT 04/ 2023;  recurrent 12/ 2023   Dysuria    History of DVT of lower extremity 11/29/2014   RLE w/ occluded peroneal vein,  completed eliquis   History of hepatitis B    remote history   History of kidney stones    History of malaria 2016   malaria [place]   History of  pulmonary embolism 11/29/2014   RLL segmental branch,  completed eliquis  (at same time DVT);    (11-17-2021  per pt had negative blood work up for disorders,  stated never had clots prior to this episode and none since)   Hypertension    followed by pcp   (nuclear stress test in epic 04-25-2014 low risk no ischemia, ef 59%)   Nocturia more than twice per night    Urgency of urination    Vitreomacular traction syndrome of right eye    per pt no vision loss   Wears glasses     Past Surgical History:  Procedure Laterality Date   CYSTOSCOPY WITH BIOPSY N/A 11/21/2021   Procedure: CYSTOSCOPY WITH BIOPSY/ FULGURATION/ LOW PRESSURE HYDRODISTENSION;  Surgeon: Janith Lima, MD;  Location: [facility];  Service: Urology;  Laterality: N/A;  ONLY NEEDS 30 MIN   CYSTOSCOPY WITH BIOPSY N/A 04/03/2022   Procedure: CYSTOSCOPY WITH BLADDER BIOPSY/ TRANSURETHRAL RESECTION OF BLADDER TUMOR;  Surgeon: Janith Lima, MD;  Location: [facility];  Service: Urology;  Laterality: N/A;  GENERAL ANESTHESIA WITH PARALYSIS/INTUBATED    Allergies: No Known Allergies  Family History  Problem Relation Age of Onset   Hypertension Mother     Social History:  reports that he has never smoked. He has never used smokeless tobacco. He reports that he does not currently use alcohol. He reports that he does not use drugs.  ROS: A complete review of systems was performed.  All systems are negative except for pertinent findings as noted.  Physical Exam:  Vital signs in last 24 hours: Temp:  [97.5 F (36.4 C)] 97.5 F (36.4 C) (12/15 0609) Pulse Rate:  [84] 84 (12/15 0609) Resp:  [17] 17 (12/15 0609) BP: (138)/(93) 138/93 (12/15 0609) SpO2:  [97 %] 97 % (12/15 0609) Weight:  [102.1 kg-102.7 kg] 102.7 kg (12/15 0609) Constitutional:  Alert and oriented, No acute distress Cardiovascular: Regular rate and rhythm Respiratory: Normal respiratory effort, Lungs clear bilaterally GI: Abdomen is soft, nontender,  nondistended, no abdominal masses GU: No CVA tenderness Lymphatic: No lymphadenopathy Neurologic: Grossly intact, no focal deficits Psychiatric: Normal mood and affect  Laboratory Data:  Recent Labs    07/28/22 0628  HGB 15.6  HCT 46.0    Recent Labs    07/28/22 0628  NA 143  K 3.5  CL 106  GLUCOSE 95  BUN 11  CREATININE 1.00     Results for orders placed or performed during the hospital encounter of 07/28/22 (from the past 24 hour(s))  I-STAT, chem 8     Status: None   Collection Time: 07/28/22  6:28 AM  Result Value Ref Range   Sodium 143 135 - 145 mmol/L   Potassium 3.5 3.5 - 5.1 mmol/L   Chloride 106 98 - 111 mmol/L   BUN 11 6 - 20 mg/dL   Creatinine, Ser 1.00 0.61 - 1.24 mg/dL   Glucose, Bld 95 70 - 99 mg/dL   Calcium, Ion 1.23 1.15 - 1.40 mmol/L   TCO2 24 22 - 32 mmol/L   Hemoglobin 15.6 13.0 - 17.0 g/dL   HCT 46.0 39.0 - 52.0 %   No results found for this or any previous visit (from the past 240 hour(s)).  Renal Function: Recent Labs    07/28/22 7356  CREATININE 1.00   Estimated Creatinine Clearance: 104.3 mL/min (by C-G formula based on SCr of 1 mg/dL).  Radiologic Imaging: No results found.  I independently reviewed the above imaging studies.  Assessment and Plan James Larsen is a 51 y.o. male with history of bladder CIS with recurrent bladder lesion here for cysto, bladder biopsy, TURBT.  Risks and benefits of Transurethral Resection of Bladder Tumor were reviewed in detail including infection, bleeding, blood transfusion, injury to bladder/urethra/surrounding structures, bladder perforation, obstructive and irritative voiding symptoms, and global anesthesia risks including but not limited to CVA, MI, DVT, PE, pneumonia, and death. Patient expressed understanding and desire to proceed.   He prefers to leave a foley catheter in postoperatively for a few days.  Matt R. Kayde Atkerson MD 07/28/2022, 7:15 AM  [facility] Pager: [phone]):  [phone]

## 2022-07-28 NOTE — Transfer of Care (Signed)
Immediate Anesthesia Transfer of Care Note  Patient: Mearl Latin  Procedure(s) Performed: CYSTOSCOPY, BLADDER BIOIPSY (Ureter) TRANSURETHRAL RESECTION OF BLADDER TUMOR (TURBT) (Ureter)  Patient Location: PACU  Anesthesia Type:General  Level of Consciousness: awake and patient cooperative  Airway & Oxygen Therapy: Patient Spontanous Breathing and Patient connected to nasal cannula oxygen  Post-op Assessment: Report given to RN and Post -op Vital signs reviewed and stable  Post vital signs: Reviewed and stable  Last Vitals:  Vitals Value Taken Time  BP 150/109 07/28/22 0832  Temp 36.5 C 07/28/22 0832  Pulse 87 07/28/22 0832  Resp 22 07/28/22 0832  SpO2 98 % 07/28/22 0832  Vitals shown include unvalidated device data.  Last Pain:  Vitals:   07/28/22 0609  TempSrc: Oral  PainSc: 5       Patients Stated Pain Goal: 2 (01/74/94 4967)  Complications: No notable events documented.

## 2022-07-31 DIAGNOSIS — G894 Chronic pain syndrome: Secondary | ICD-10-CM | POA: Diagnosis not present

## 2022-07-31 DIAGNOSIS — I10 Essential (primary) hypertension: Secondary | ICD-10-CM | POA: Diagnosis not present

## 2022-07-31 DIAGNOSIS — D090 Carcinoma in situ of bladder: Secondary | ICD-10-CM | POA: Diagnosis not present

## 2022-07-31 DIAGNOSIS — R309 Painful micturition, unspecified: Secondary | ICD-10-CM | POA: Diagnosis not present

## 2022-08-01 ENCOUNTER — Encounter (HOSPITAL_BASED_OUTPATIENT_CLINIC_OR_DEPARTMENT_OTHER): Payer: Self-pay | Admitting: Urology

## 2022-08-01 LAB — SURGICAL PATHOLOGY

## 2022-08-10 DIAGNOSIS — D090 Carcinoma in situ of bladder: Secondary | ICD-10-CM | POA: Diagnosis not present

## 2022-08-10 DIAGNOSIS — Z125 Encounter for screening for malignant neoplasm of prostate: Secondary | ICD-10-CM | POA: Diagnosis not present

## 2022-08-10 DIAGNOSIS — R102 Pelvic and perineal pain: Secondary | ICD-10-CM | POA: Diagnosis not present

## 2022-08-28 DIAGNOSIS — D090 Carcinoma in situ of bladder: Secondary | ICD-10-CM | POA: Diagnosis not present

## 2022-08-28 DIAGNOSIS — I10 Essential (primary) hypertension: Secondary | ICD-10-CM | POA: Diagnosis not present

## 2022-08-28 DIAGNOSIS — G8929 Other chronic pain: Secondary | ICD-10-CM | POA: Diagnosis not present

## 2022-08-28 DIAGNOSIS — R309 Painful micturition, unspecified: Secondary | ICD-10-CM | POA: Diagnosis not present

## 2022-09-12 DIAGNOSIS — C679 Malignant neoplasm of bladder, unspecified: Secondary | ICD-10-CM | POA: Diagnosis not present

## 2022-09-12 DIAGNOSIS — D090 Carcinoma in situ of bladder: Secondary | ICD-10-CM | POA: Diagnosis not present

## 2022-09-13 DIAGNOSIS — C679 Malignant neoplasm of bladder, unspecified: Secondary | ICD-10-CM | POA: Diagnosis not present

## 2022-09-19 DIAGNOSIS — Z86711 Personal history of pulmonary embolism: Secondary | ICD-10-CM | POA: Diagnosis not present

## 2022-09-19 DIAGNOSIS — Z79899 Other long term (current) drug therapy: Secondary | ICD-10-CM | POA: Diagnosis not present

## 2022-09-19 DIAGNOSIS — N3289 Other specified disorders of bladder: Secondary | ICD-10-CM | POA: Diagnosis not present

## 2022-09-19 DIAGNOSIS — C679 Malignant neoplasm of bladder, unspecified: Secondary | ICD-10-CM | POA: Diagnosis not present

## 2022-09-19 DIAGNOSIS — N400 Enlarged prostate without lower urinary tract symptoms: Secondary | ICD-10-CM | POA: Diagnosis not present

## 2022-09-19 DIAGNOSIS — D090 Carcinoma in situ of bladder: Secondary | ICD-10-CM | POA: Diagnosis not present

## 2022-09-19 DIAGNOSIS — C674 Malignant neoplasm of posterior wall of bladder: Secondary | ICD-10-CM | POA: Diagnosis not present

## 2022-09-19 DIAGNOSIS — N3020 Other chronic cystitis without hematuria: Secondary | ICD-10-CM | POA: Diagnosis not present

## 2022-09-19 DIAGNOSIS — Z86718 Personal history of other venous thrombosis and embolism: Secondary | ICD-10-CM | POA: Diagnosis not present

## 2022-09-19 DIAGNOSIS — N3000 Acute cystitis without hematuria: Secondary | ICD-10-CM | POA: Diagnosis not present

## 2022-09-19 HISTORY — PX: OTHER SURGICAL HISTORY: SHX169

## 2022-10-02 DIAGNOSIS — G8929 Other chronic pain: Secondary | ICD-10-CM | POA: Diagnosis not present

## 2022-10-02 DIAGNOSIS — R309 Painful micturition, unspecified: Secondary | ICD-10-CM | POA: Diagnosis not present

## 2022-10-02 DIAGNOSIS — I10 Essential (primary) hypertension: Secondary | ICD-10-CM | POA: Diagnosis not present

## 2022-10-02 DIAGNOSIS — N200 Calculus of kidney: Secondary | ICD-10-CM | POA: Diagnosis not present

## 2022-11-07 DIAGNOSIS — R310 Gross hematuria: Secondary | ICD-10-CM | POA: Diagnosis not present

## 2022-11-07 DIAGNOSIS — N401 Enlarged prostate with lower urinary tract symptoms: Secondary | ICD-10-CM | POA: Diagnosis not present

## 2022-11-07 DIAGNOSIS — R3915 Urgency of urination: Secondary | ICD-10-CM | POA: Diagnosis not present

## 2022-11-07 DIAGNOSIS — D090 Carcinoma in situ of bladder: Secondary | ICD-10-CM | POA: Diagnosis not present

## 2022-11-08 DIAGNOSIS — Z131 Encounter for screening for diabetes mellitus: Secondary | ICD-10-CM | POA: Diagnosis not present

## 2022-11-08 DIAGNOSIS — J4520 Mild intermittent asthma, uncomplicated: Secondary | ICD-10-CM | POA: Diagnosis not present

## 2022-11-08 DIAGNOSIS — E559 Vitamin D deficiency, unspecified: Secondary | ICD-10-CM | POA: Diagnosis not present

## 2022-11-08 DIAGNOSIS — Z0000 Encounter for general adult medical examination without abnormal findings: Secondary | ICD-10-CM | POA: Diagnosis not present

## 2022-11-08 DIAGNOSIS — C679 Malignant neoplasm of bladder, unspecified: Secondary | ICD-10-CM | POA: Diagnosis not present

## 2022-11-08 DIAGNOSIS — I10 Essential (primary) hypertension: Secondary | ICD-10-CM | POA: Diagnosis not present

## 2022-11-08 DIAGNOSIS — Z13220 Encounter for screening for lipoid disorders: Secondary | ICD-10-CM | POA: Diagnosis not present

## 2022-11-13 DIAGNOSIS — E7849 Other hyperlipidemia: Secondary | ICD-10-CM | POA: Diagnosis not present

## 2022-11-13 DIAGNOSIS — R0789 Other chest pain: Secondary | ICD-10-CM | POA: Diagnosis not present

## 2022-11-13 DIAGNOSIS — L309 Dermatitis, unspecified: Secondary | ICD-10-CM | POA: Diagnosis not present

## 2022-11-13 DIAGNOSIS — I10 Essential (primary) hypertension: Secondary | ICD-10-CM | POA: Diagnosis not present

## 2022-11-23 DIAGNOSIS — H11003 Unspecified pterygium of eye, bilateral: Secondary | ICD-10-CM | POA: Diagnosis not present

## 2022-11-23 DIAGNOSIS — H33101 Unspecified retinoschisis, right eye: Secondary | ICD-10-CM | POA: Diagnosis not present

## 2022-11-23 DIAGNOSIS — H35383 Toxic maculopathy, bilateral: Secondary | ICD-10-CM | POA: Diagnosis not present

## 2022-12-11 DIAGNOSIS — Z8551 Personal history of malignant neoplasm of bladder: Secondary | ICD-10-CM | POA: Diagnosis not present

## 2022-12-11 DIAGNOSIS — I10 Essential (primary) hypertension: Secondary | ICD-10-CM | POA: Diagnosis not present

## 2022-12-11 DIAGNOSIS — E6609 Other obesity due to excess calories: Secondary | ICD-10-CM | POA: Diagnosis not present

## 2022-12-11 DIAGNOSIS — R072 Precordial pain: Secondary | ICD-10-CM | POA: Diagnosis not present

## 2022-12-25 DIAGNOSIS — I10 Essential (primary) hypertension: Secondary | ICD-10-CM | POA: Diagnosis not present

## 2022-12-25 DIAGNOSIS — J4520 Mild intermittent asthma, uncomplicated: Secondary | ICD-10-CM | POA: Diagnosis not present

## 2022-12-25 DIAGNOSIS — C679 Malignant neoplasm of bladder, unspecified: Secondary | ICD-10-CM | POA: Diagnosis not present

## 2023-01-10 DIAGNOSIS — N200 Calculus of kidney: Secondary | ICD-10-CM | POA: Diagnosis not present

## 2023-01-10 DIAGNOSIS — D090 Carcinoma in situ of bladder: Secondary | ICD-10-CM | POA: Diagnosis not present

## 2023-01-10 DIAGNOSIS — G8929 Other chronic pain: Secondary | ICD-10-CM | POA: Diagnosis not present

## 2023-01-10 DIAGNOSIS — G893 Neoplasm related pain (acute) (chronic): Secondary | ICD-10-CM | POA: Diagnosis not present

## 2023-01-10 DIAGNOSIS — I10 Essential (primary) hypertension: Secondary | ICD-10-CM | POA: Diagnosis not present

## 2023-01-10 DIAGNOSIS — G8918 Other acute postprocedural pain: Secondary | ICD-10-CM | POA: Diagnosis not present

## 2023-03-27 DIAGNOSIS — R3915 Urgency of urination: Secondary | ICD-10-CM | POA: Diagnosis not present

## 2023-03-27 DIAGNOSIS — D090 Carcinoma in situ of bladder: Secondary | ICD-10-CM | POA: Diagnosis not present

## 2023-03-27 DIAGNOSIS — N401 Enlarged prostate with lower urinary tract symptoms: Secondary | ICD-10-CM | POA: Diagnosis not present

## 2023-03-28 ENCOUNTER — Other Ambulatory Visit: Payer: Self-pay | Admitting: Internal Medicine

## 2023-03-28 DIAGNOSIS — I10 Essential (primary) hypertension: Secondary | ICD-10-CM | POA: Diagnosis not present

## 2023-03-28 DIAGNOSIS — C679 Malignant neoplasm of bladder, unspecified: Secondary | ICD-10-CM | POA: Diagnosis not present

## 2023-03-28 DIAGNOSIS — J4520 Mild intermittent asthma, uncomplicated: Secondary | ICD-10-CM | POA: Diagnosis not present

## 2023-03-28 DIAGNOSIS — E7849 Other hyperlipidemia: Secondary | ICD-10-CM | POA: Diagnosis not present

## 2023-03-29 LAB — LIPID PANEL
Cholesterol: 158 mg/dL (ref <200)
HDL: 59 mg/dL (ref >=40)
LDL Cholesterol (Calc): 85 mg/dL
Non-HDL Cholesterol (Calc): 99 mg/dL (ref <130)
Total CHOL/HDL Ratio: 2.7 (calc) (ref <5.0)
Triglycerides: 67 mg/dL (ref <150)

## 2023-03-29 LAB — HEPATIC FUNCTION PANEL
AG Ratio: 1.7 (calc) (ref 1.0–2.5)
ALT: 32 U/L (ref 9–46)
AST: 23 U/L (ref 10–35)
Albumin: 4.8 g/dL (ref 3.6–5.1)
Alkaline phosphatase (APISO): 48 U/L (ref 35–144)
Bilirubin, Direct: 0.3 mg/dL — ABNORMAL HIGH (ref 0.0–0.2)
Globulin: 2.8 g/dL (ref 1.9–3.7)
Indirect Bilirubin: 0.9 mg/dL (ref 0.2–1.2)
Total Bilirubin: 1.2 mg/dL (ref 0.2–1.2)
Total Protein: 7.6 g/dL (ref 6.1–8.1)

## 2023-03-29 LAB — EXTRA LAV TOP TUBE

## 2023-04-13 IMAGING — CT CT RENAL STONE PROTOCOL
2 of 4 series · 16 of 46 positions shown, 18 images · non-contrast
Comparison: CT examination dated December 01, 2021

CLINICAL DATA: Flank pain, kidney stone suspected. Right flank
pain.



[Series 2: axial st · axial · 0.98mm/px · z∈[-553,-83]mm · 13 of 108 slices shown, 15 images]
[im 7/108  soft-tissue]
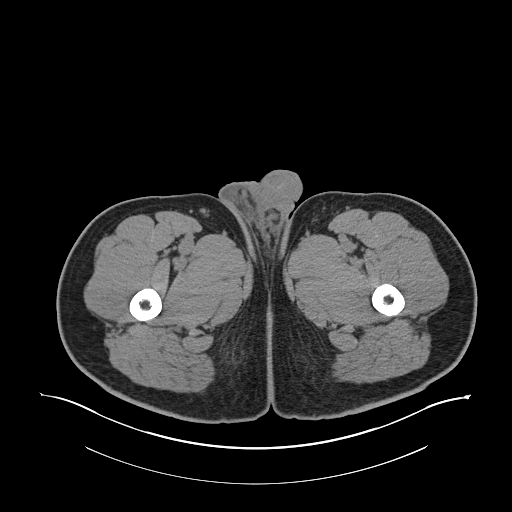
[im 7/108  bone]
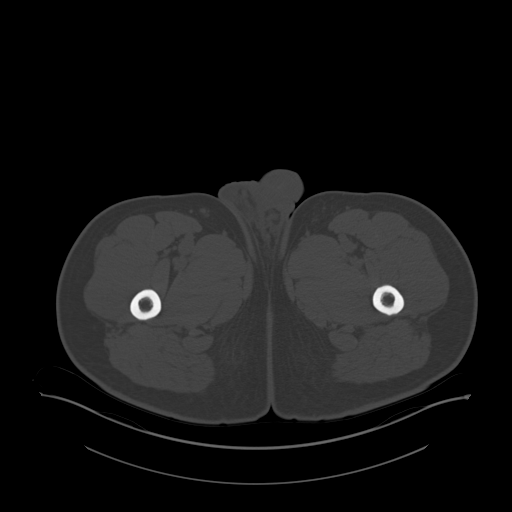
[im 14/108  soft-tissue]
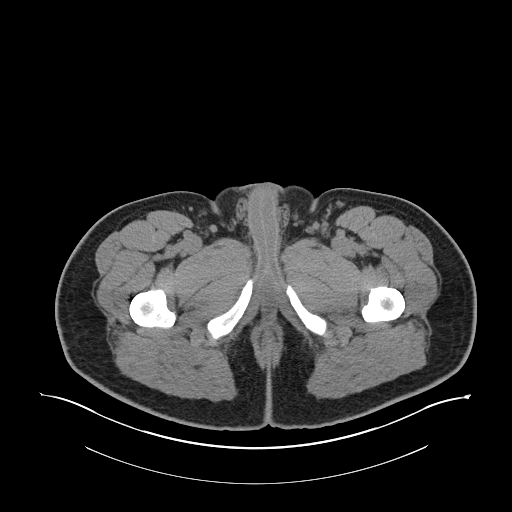
[im 21/108  soft-tissue]
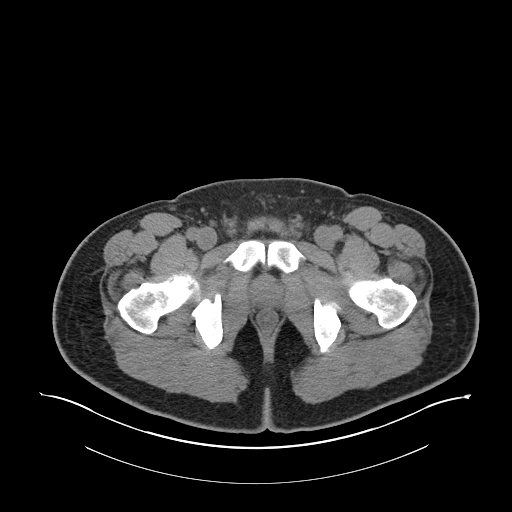
[im 34/108  soft-tissue]
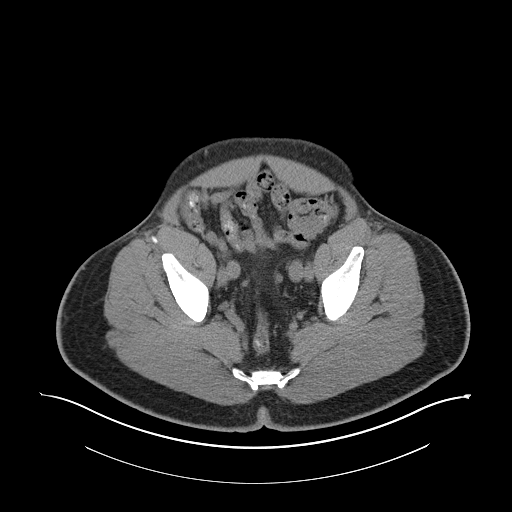
[im 41/108  soft-tissue]
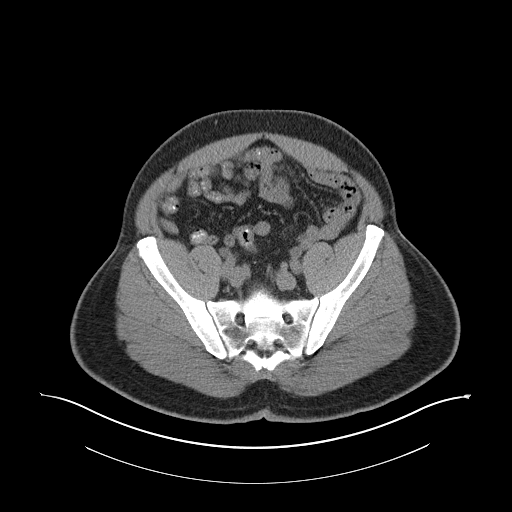
[im 47/108  soft-tissue]
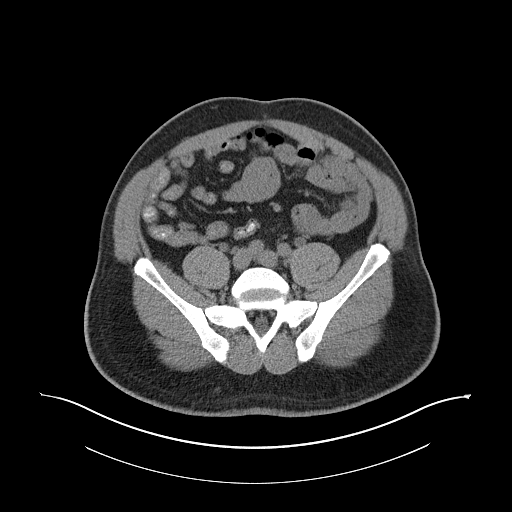
[im 54/108  soft-tissue]
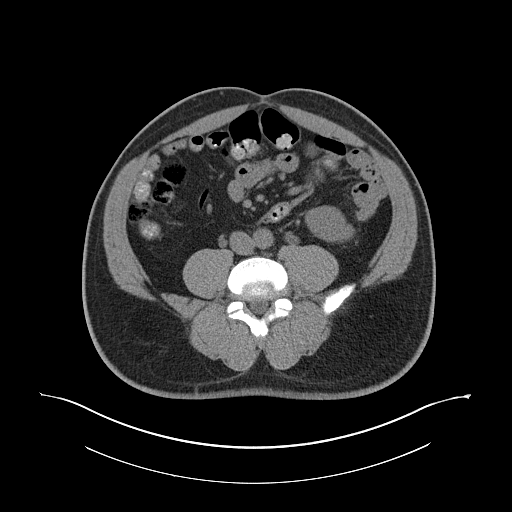
[im 61/108  soft-tissue]
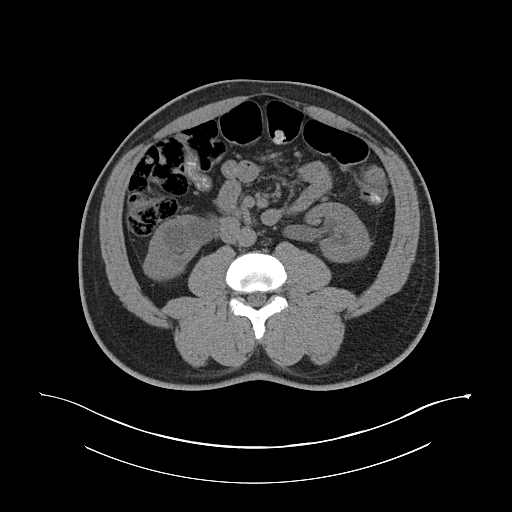
[im 67/108  soft-tissue]
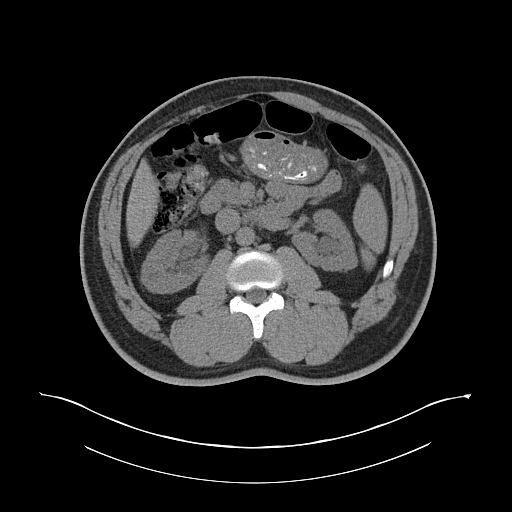
[im 67/108  bone]
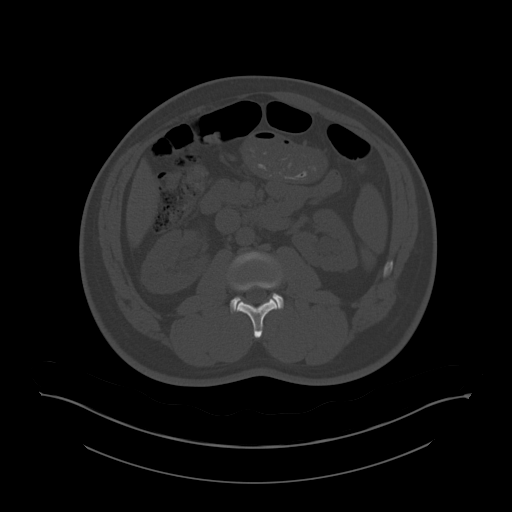
[im 74/108  soft-tissue]
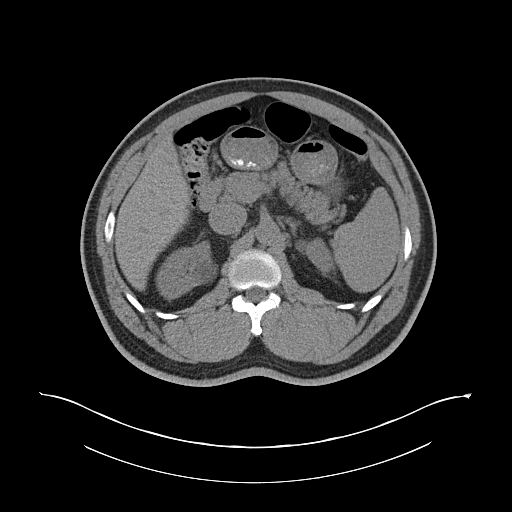
[im 87/108  soft-tissue]
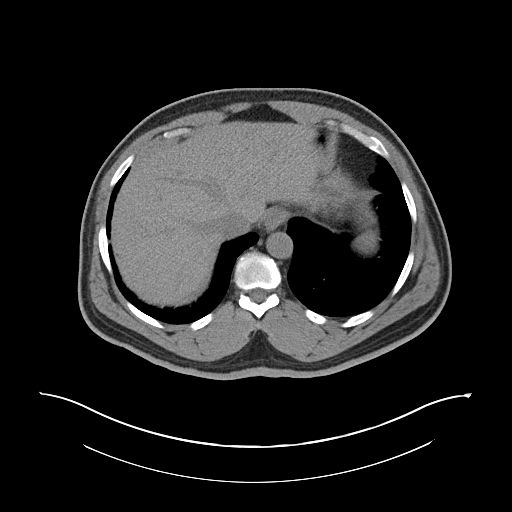
[im 94/108  soft-tissue]
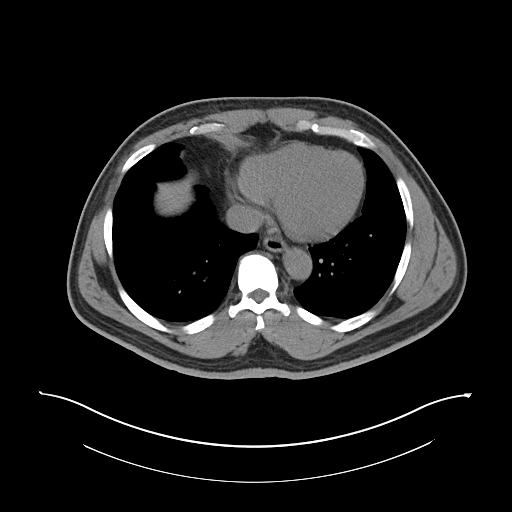
[im 101/108  soft-tissue]
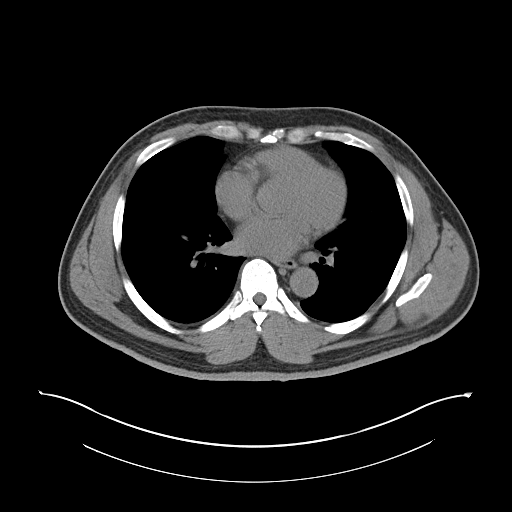

[Series 5: coronal · coronal · 0.95mm/px · 3 of 184 slices shown]
[im 62/184  soft-tissue]
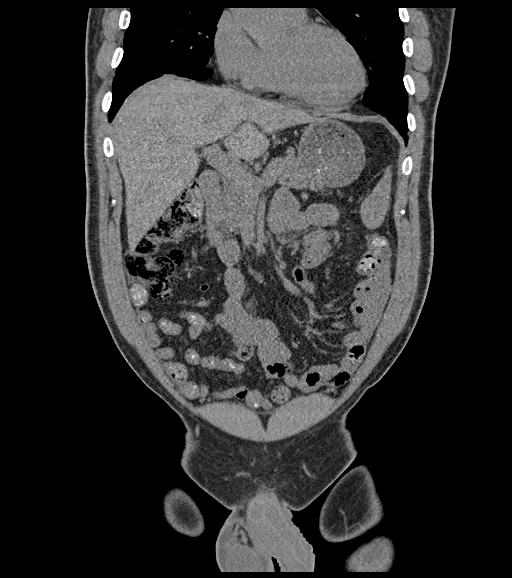
[im 82/184  soft-tissue]
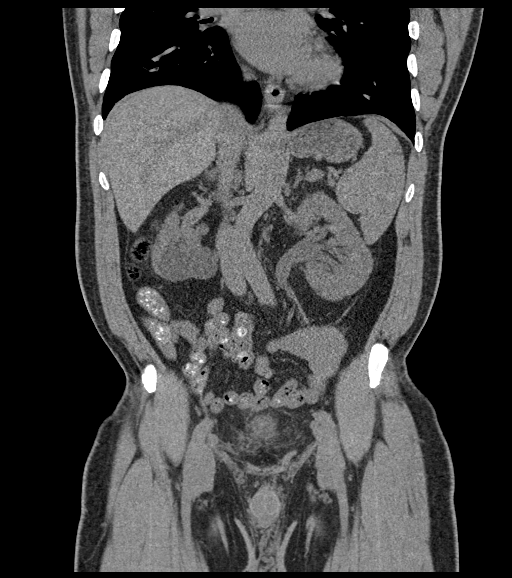
[im 102/184  soft-tissue]
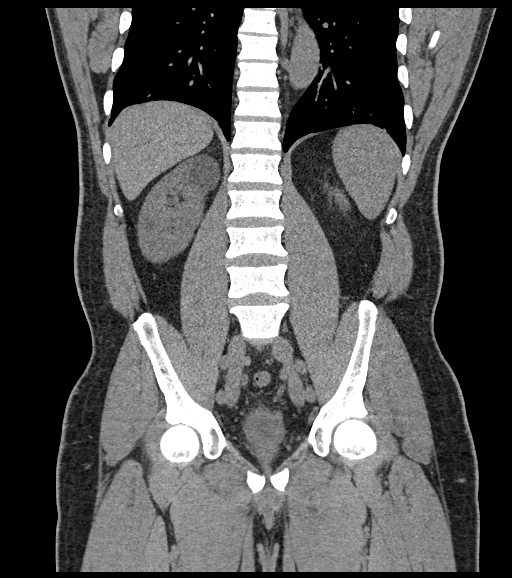

[16 of 46 positions shown; findings below may reference images not displayed]

FINDINGS: Lower chest: No acute abnormality.

Hepatobiliary: No focal liver abnormality is seen. No gallstones,
gallbladder wall thickening, or biliary dilatation.

Pancreas: Unremarkable. No pancreatic ductal dilatation or
surrounding inflammatory changes.

Spleen: Normal in size without focal abnormality.

Adrenals/Urinary Tract: Adrenal glands are unremarkable. Simple cyst
in the upper and lower pole of the right kidney, which do not
require follow-up. Kidneys are normal, without renal calculi, focal
lesion, or hydronephrosis. Prominent bilateral ureters without
evidence of ureteral calculus or periureteral fat stranding. Urinary
bladder is underdistended. There is trace amount of air in the
urinary bladder, which may be secondary to instrumentation or
urinary tract infection. Correlate with urinalysis.

Stomach/Bowel: Stomach is within normal limits. Appendix appears
normal. No evidence of bowel wall thickening, distention, or
inflammatory changes.

Vascular/Lymphatic: No significant vascular findings are present. No
enlarged abdominal or pelvic lymph nodes.

Reproductive: Prostate is unremarkable.

Other: No abdominal wall hernia or abnormality. No abdominopelvic
ascites.

Musculoskeletal: No acute or significant osseous findings.
IMPRESSION: 1. No evidence of nephrolithiasis or hydronephrosis. No ureteral
calculus.

2. Urinary bladder wall thickening with trace amount of air in the
urinary bladder, which could be secondary to instrumentation or
urinary tract infection. Correlate with urinalysis.

3. Simple cysts in the right kidney, which do not require follow-up.

## 2023-04-18 DIAGNOSIS — N281 Cyst of kidney, acquired: Secondary | ICD-10-CM | POA: Diagnosis not present

## 2023-04-18 DIAGNOSIS — N3289 Other specified disorders of bladder: Secondary | ICD-10-CM | POA: Diagnosis not present

## 2023-04-18 DIAGNOSIS — D090 Carcinoma in situ of bladder: Secondary | ICD-10-CM | POA: Diagnosis not present

## 2023-04-18 DIAGNOSIS — K429 Umbilical hernia without obstruction or gangrene: Secondary | ICD-10-CM | POA: Diagnosis not present

## 2023-05-10 DIAGNOSIS — N281 Cyst of kidney, acquired: Secondary | ICD-10-CM | POA: Diagnosis not present

## 2023-05-10 DIAGNOSIS — D090 Carcinoma in situ of bladder: Secondary | ICD-10-CM | POA: Diagnosis not present

## 2023-05-10 DIAGNOSIS — R8289 Other abnormal findings on cytological and histological examination of urine: Secondary | ICD-10-CM | POA: Diagnosis not present

## 2023-05-17 ENCOUNTER — Other Ambulatory Visit: Payer: Self-pay | Admitting: Urology

## 2023-05-24 ENCOUNTER — Encounter (HOSPITAL_BASED_OUTPATIENT_CLINIC_OR_DEPARTMENT_OTHER): Payer: Self-pay | Admitting: Urology

## 2023-05-24 DIAGNOSIS — H35383 Toxic maculopathy, bilateral: Secondary | ICD-10-CM | POA: Diagnosis not present

## 2023-05-24 DIAGNOSIS — H33101 Unspecified retinoschisis, right eye: Secondary | ICD-10-CM | POA: Diagnosis not present

## 2023-05-24 DIAGNOSIS — H11003 Unspecified pterygium of eye, bilateral: Secondary | ICD-10-CM | POA: Diagnosis not present

## 2023-05-29 ENCOUNTER — Encounter (HOSPITAL_BASED_OUTPATIENT_CLINIC_OR_DEPARTMENT_OTHER): Payer: Self-pay | Admitting: Urology

## 2023-05-29 ENCOUNTER — Other Ambulatory Visit: Payer: Self-pay

## 2023-05-29 NOTE — Progress Notes (Signed)
Spoke w/ via phone for pre-op interview---pt Lab needs dos---- I stat        Lab results------EKG 07-28-2022 epic COVID test -----patient states asymptomatic no test needed Arrive at -------1100 06-04-2023 NPO after MN NO Solid Food.  Clear liquids from MN until---1000 Med rec completed Medications to take morning of surgery -----norvasc Diabetic medication -----n/a Patient instructed no nail polish to be worn day of surgery Patient instructed to bring photo id and insurance card day of surgery Patient aware to have Driver (ride ) / caregiver    for 24 hours after surgery -  wife James Larsen Patient Special Instructions -----pt stopped 325 mg aspirin on own, last dose was 05-27-2023 Pre-Op special Instructions -----none Patient verbalized understanding of instructions that were given at this phone interview. Patient denies chest pain, sob, fever, cough at the interview.

## 2023-06-03 NOTE — Plan of Care (Signed)
[facility] Tonsillectomy/Adenoidectomy, Postoperative PEDS care plan entered in error.

## 2023-06-04 ENCOUNTER — Ambulatory Visit (HOSPITAL_BASED_OUTPATIENT_CLINIC_OR_DEPARTMENT_OTHER): Payer: BC Managed Care – PPO | Admitting: Anesthesiology

## 2023-06-04 ENCOUNTER — Ambulatory Visit (HOSPITAL_BASED_OUTPATIENT_CLINIC_OR_DEPARTMENT_OTHER)
Admission: RE | Admit: 2023-06-04 | Discharge: 2023-06-04 | Disposition: A | Discharge status: Home / Self Care | Payer: BC Managed Care – PPO | Attending: Urology | Admitting: Urology

## 2023-06-04 ENCOUNTER — Encounter (HOSPITAL_BASED_OUTPATIENT_CLINIC_OR_DEPARTMENT_OTHER)
Admission: RE | Disposition: A | Discharge status: Home / Self Care | Payer: Self-pay | Source: Home / Self Care | Attending: Urology

## 2023-06-04 ENCOUNTER — Encounter (HOSPITAL_BASED_OUTPATIENT_CLINIC_OR_DEPARTMENT_OTHER): Payer: Self-pay | Admitting: Urology

## 2023-06-04 ENCOUNTER — Emergency Department (HOSPITAL_COMMUNITY): Payer: BC Managed Care – PPO

## 2023-06-04 ENCOUNTER — Other Ambulatory Visit: Payer: Self-pay

## 2023-06-04 ENCOUNTER — Emergency Department (HOSPITAL_COMMUNITY)
Admission: EM | Admit: 2023-06-04 | Discharge: 2023-06-05 | Discharge status: Against Medical Advice | Payer: BC Managed Care – PPO | Attending: Emergency Medicine | Admitting: Emergency Medicine

## 2023-06-04 DIAGNOSIS — N136 Pyonephrosis: Secondary | ICD-10-CM | POA: Diagnosis not present

## 2023-06-04 DIAGNOSIS — N135 Crossing vessel and stricture of ureter without hydronephrosis: Secondary | ICD-10-CM | POA: Diagnosis not present

## 2023-06-04 DIAGNOSIS — Z87442 Personal history of urinary calculi: Secondary | ICD-10-CM | POA: Insufficient documentation

## 2023-06-04 DIAGNOSIS — N3289 Other specified disorders of bladder: Secondary | ICD-10-CM | POA: Diagnosis not present

## 2023-06-04 DIAGNOSIS — R109 Unspecified abdominal pain: Secondary | ICD-10-CM | POA: Insufficient documentation

## 2023-06-04 DIAGNOSIS — D090 Carcinoma in situ of bladder: Secondary | ICD-10-CM | POA: Diagnosis not present

## 2023-06-04 DIAGNOSIS — N35919 Unspecified urethral stricture, male, unspecified site: Secondary | ICD-10-CM | POA: Diagnosis not present

## 2023-06-04 DIAGNOSIS — Z8249 Family history of ischemic heart disease and other diseases of the circulatory system: Secondary | ICD-10-CM | POA: Diagnosis not present

## 2023-06-04 DIAGNOSIS — I10 Essential (primary) hypertension: Secondary | ICD-10-CM | POA: Diagnosis not present

## 2023-06-04 DIAGNOSIS — N131 Hydronephrosis with ureteral stricture, not elsewhere classified: Secondary | ICD-10-CM | POA: Diagnosis not present

## 2023-06-04 DIAGNOSIS — Z5321 Procedure and treatment not carried out due to patient leaving prior to being seen by health care provider: Secondary | ICD-10-CM | POA: Insufficient documentation

## 2023-06-04 DIAGNOSIS — Z01818 Encounter for other preprocedural examination: Secondary | ICD-10-CM

## 2023-06-04 DIAGNOSIS — K760 Fatty (change of) liver, not elsewhere classified: Secondary | ICD-10-CM | POA: Diagnosis not present

## 2023-06-04 DIAGNOSIS — N289 Disorder of kidney and ureter, unspecified: Secondary | ICD-10-CM | POA: Diagnosis not present

## 2023-06-04 DIAGNOSIS — N1330 Unspecified hydronephrosis: Secondary | ICD-10-CM | POA: Diagnosis not present

## 2023-06-04 DIAGNOSIS — N281 Cyst of kidney, acquired: Secondary | ICD-10-CM | POA: Insufficient documentation

## 2023-06-04 HISTORY — PX: CYSTOSCOPY/URETEROSCOPY/HOLMIUM LASER/STENT PLACEMENT: SHX6546

## 2023-06-04 HISTORY — DX: Hyperlipidemia, unspecified: E78.5

## 2023-06-04 LAB — POCT I-STAT, CHEM 8
BUN: 13 mg/dL (ref 6–20)
Calcium, Ion: 1.31 mmol/L (ref 1.15–1.40)
Chloride: 105 mmol/L (ref 98–111)
Creatinine, Ser: 1.2 mg/dL (ref 0.61–1.24)
Glucose, Bld: 87 mg/dL (ref 70–99)
HCT: 47 % (ref 39.0–52.0)
Hemoglobin: 16 g/dL (ref 13.0–17.0)
Potassium: 3.7 mmol/L (ref 3.5–5.1)
Sodium: 142 mmol/L (ref 135–145)
TCO2: 24 mmol/L (ref 22–32)

## 2023-06-04 LAB — CBC WITH DIFFERENTIAL/PLATELET
Abs Immature Granulocytes: 0.01 10*3/uL (ref 0.00–0.07)
Basophils Absolute: 0 10*3/uL (ref 0.0–0.1)
Basophils Relative: 0 %
Eosinophils Absolute: 0 10*3/uL (ref 0.0–0.5)
Eosinophils Relative: 0 %
HCT: 48.6 % (ref 39.0–52.0)
Hemoglobin: 16.8 g/dL (ref 13.0–17.0)
Immature Granulocytes: 0 %
Lymphocytes Relative: 10 %
Lymphs Abs: 0.5 10*3/uL — ABNORMAL LOW (ref 0.7–4.0)
MCH: 30 pg (ref 26.0–34.0)
MCHC: 34.6 g/dL (ref 30.0–36.0)
MCV: 86.8 fL (ref 80.0–100.0)
Monocytes Absolute: 0 10*3/uL — ABNORMAL LOW (ref 0.1–1.0)
Monocytes Relative: 1 %
Neutro Abs: 4.2 10*3/uL (ref 1.7–7.7)
Neutrophils Relative %: 89 %
Platelets: 197 10*3/uL (ref 150–400)
RBC: 5.6 MIL/uL (ref 4.22–5.81)
RDW: 12 % (ref 11.5–15.5)
WBC: 4.7 10*3/uL (ref 4.0–10.5)
nRBC: 0 % (ref 0.0–0.2)

## 2023-06-04 LAB — I-STAT CHEM 8, ED
BUN: 14 mg/dL (ref 6–20)
Calcium, Ion: 1.25 mmol/L (ref 1.15–1.40)
Chloride: 101 mmol/L (ref 98–111)
Creatinine, Ser: 1.3 mg/dL — ABNORMAL HIGH (ref 0.61–1.24)
Glucose, Bld: 126 mg/dL — ABNORMAL HIGH (ref 70–99)
HCT: 49 % (ref 39.0–52.0)
Hemoglobin: 16.7 g/dL (ref 13.0–17.0)
Potassium: 3.7 mmol/L (ref 3.5–5.1)
Sodium: 141 mmol/L (ref 135–145)
TCO2: 26 mmol/L (ref 22–32)

## 2023-06-04 LAB — COMPREHENSIVE METABOLIC PANEL
ALT: 38 U/L (ref 0–44)
AST: 27 U/L (ref 15–41)
Albumin: 4.9 g/dL (ref 3.5–5.0)
Alkaline Phosphatase: 52 U/L (ref 38–126)
Anion gap: 12 (ref 5–15)
BUN: 15 mg/dL (ref 6–20)
CO2: 26 mmol/L (ref 22–32)
Calcium: 9.6 mg/dL (ref 8.9–10.3)
Chloride: 102 mmol/L (ref 98–111)
Creatinine, Ser: 1.22 mg/dL (ref 0.61–1.24)
GFR, Estimated: >60 mL/min (ref >60)
Glucose, Bld: 128 mg/dL — ABNORMAL HIGH (ref 70–99)
Potassium: 3.5 mmol/L (ref 3.5–5.1)
Sodium: 140 mmol/L (ref 135–145)
Total Bilirubin: 0.8 mg/dL (ref 0.3–1.2)
Total Protein: 8.4 g/dL — ABNORMAL HIGH (ref 6.5–8.1)

## 2023-06-04 SURGERY — CYSTOSCOPY/URETEROSCOPY/HOLMIUM LASER/STENT PLACEMENT
Anesthesia: General | Site: Renal | Laterality: Left

## 2023-06-04 MED ORDER — ONDANSETRON HCL 4 MG/2ML IJ SOLN: 4.0000 mg | Freq: Once | INTRAMUSCULAR | Status: DC | PRN

## 2023-06-04 MED ORDER — EPHEDRINE SULFATE-NACL 50-0.9 MG/10ML-% IV SOSY
PREFILLED_SYRINGE | INTRAVENOUS | Status: DC | PRN
  Administered 2023-06-04: 10 mg via INTRAVENOUS
  Administered 2023-06-04: 5 mg via INTRAVENOUS

## 2023-06-04 MED ORDER — SODIUM CHLORIDE 0.9 % IR SOLN
Status: DC | PRN
  Administered 2023-06-04: 3000 mL

## 2023-06-04 MED ORDER — IOHEXOL 300 MG/ML  SOLN
100.0000 mL | Freq: Once | INTRAMUSCULAR | Status: AC | PRN
  Administered 2023-06-04: 100 mL via INTRAVENOUS

## 2023-06-04 MED ORDER — ACETAMINOPHEN 325 MG PO TABS: 325.0000 mg | ORAL_TABLET | ORAL | Status: DC | PRN

## 2023-06-04 MED ORDER — FENTANYL CITRATE (PF) 100 MCG/2ML IJ SOLN: 25.0000 ug | INTRAMUSCULAR | Status: DC | PRN

## 2023-06-04 MED ORDER — PROPOFOL 10 MG/ML IV BOLUS
INTRAVENOUS | Status: AC
  Filled 2023-06-04: qty 20

## 2023-06-04 MED ORDER — LIDOCAINE 2% (20 MG/ML) 5 ML SYRINGE
INTRAMUSCULAR | Status: DC | PRN
  Administered 2023-06-04: 100 mg via INTRAVENOUS

## 2023-06-04 MED ORDER — OXYCODONE HCL 5 MG/5ML PO SOLN: 5.0000 mg | Freq: Once | ORAL | Status: AC | PRN

## 2023-06-04 MED ORDER — ONDANSETRON HCL 4 MG/2ML IJ SOLN
INTRAMUSCULAR | Status: AC
  Filled 2023-06-04: qty 2

## 2023-06-04 MED ORDER — LACTATED RINGERS IV SOLN: INTRAVENOUS | Status: DC

## 2023-06-04 MED ORDER — ACETAMINOPHEN 160 MG/5ML PO SOLN: 325.0000 mg | ORAL | Status: DC | PRN

## 2023-06-04 MED ORDER — DEXAMETHASONE SODIUM PHOSPHATE 10 MG/ML IJ SOLN
INTRAMUSCULAR | Status: DC | PRN
  Administered 2023-06-04: 5 mg via INTRAVENOUS

## 2023-06-04 MED ORDER — MIDAZOLAM HCL 5 MG/5ML IJ SOLN
INTRAMUSCULAR | Status: DC | PRN
  Administered 2023-06-04: 2 mg via INTRAVENOUS

## 2023-06-04 MED ORDER — CEFAZOLIN SODIUM-DEXTROSE 2-4 GM/100ML-% IV SOLN
2.0000 g | INTRAVENOUS | Status: AC
  Administered 2023-06-04: 2 g via INTRAVENOUS

## 2023-06-04 MED ORDER — MIDAZOLAM HCL 2 MG/2ML IJ SOLN
INTRAMUSCULAR | Status: AC
  Filled 2023-06-04: qty 2

## 2023-06-04 MED ORDER — FENTANYL CITRATE (PF) 100 MCG/2ML IJ SOLN
INTRAMUSCULAR | Status: DC | PRN
  Administered 2023-06-04: 100 ug via INTRAVENOUS

## 2023-06-04 MED ORDER — KETOROLAC TROMETHAMINE 30 MG/ML IJ SOLN: 30.0000 mg | Freq: Once | INTRAMUSCULAR | Status: DC | PRN

## 2023-06-04 MED ORDER — DEXAMETHASONE SODIUM PHOSPHATE 10 MG/ML IJ SOLN
INTRAMUSCULAR | Status: AC
  Filled 2023-06-04: qty 1

## 2023-06-04 MED ORDER — EPHEDRINE 5 MG/ML INJ
INTRAVENOUS | Status: AC
  Filled 2023-06-04: qty 5

## 2023-06-04 MED ORDER — FENTANYL CITRATE (PF) 100 MCG/2ML IJ SOLN
INTRAMUSCULAR | Status: AC
  Filled 2023-06-04: qty 2

## 2023-06-04 MED ORDER — OXYCODONE HCL 5 MG PO TABS
ORAL_TABLET | ORAL | Status: AC
  Filled 2023-06-04: qty 1

## 2023-06-04 MED ORDER — PROPOFOL 10 MG/ML IV BOLUS
INTRAVENOUS | Status: DC | PRN
  Administered 2023-06-04: 200 mg via INTRAVENOUS

## 2023-06-04 MED ORDER — 0.9 % SODIUM CHLORIDE (POUR BTL) OPTIME
TOPICAL | Status: DC | PRN
  Administered 2023-06-04: 500 mL

## 2023-06-04 MED ORDER — OXYCODONE HCL 5 MG PO TABS
5.0000 mg | ORAL_TABLET | Freq: Once | ORAL | Status: AC | PRN
  Administered 2023-06-04: 5 mg via ORAL

## 2023-06-04 MED ORDER — IOHEXOL 300 MG/ML  SOLN
INTRAMUSCULAR | Status: DC | PRN
  Administered 2023-06-04: 10 mL via URETHRAL

## 2023-06-04 MED ORDER — ONDANSETRON HCL 4 MG/2ML IJ SOLN
INTRAMUSCULAR | Status: DC | PRN
  Administered 2023-06-04: 4 mg via INTRAVENOUS

## 2023-06-04 MED ORDER — CEFAZOLIN SODIUM-DEXTROSE 2-4 GM/100ML-% IV SOLN
INTRAVENOUS | Status: AC
  Filled 2023-06-04: qty 100

## 2023-06-04 MED ORDER — MEPERIDINE HCL 25 MG/ML IJ SOLN
6.2500 mg | INTRAMUSCULAR | Status: DC | PRN
Start: 2023-06-04 — End: 2023-06-04

## 2023-06-04 SURGICAL SUPPLY — 26 items
BAG DRAIN URO-CYSTO SKYTR STRL (DRAIN) ×1 IMPLANT
BAG DRN UROCATH (DRAIN) ×1
BASKET ZERO TIP NITINOL 2.4FR (BASKET) IMPLANT
BRUSH URET BIOPSY 3F (UROLOGICAL SUPPLIES) IMPLANT
BSKT STON RTRVL ZERO TP 2.4FR (BASKET)
CATH SET URETHRAL DILATOR (CATHETERS) IMPLANT
CATH URETL OPEN 5X70 (CATHETERS) ×1 IMPLANT
CLOTH BEACON ORANGE TIMEOUT ST (SAFETY) ×1 IMPLANT
GLOVE BIO SURGEON STRL SZ7 (GLOVE) ×1 IMPLANT
GOWN STRL REUS W/TWL LRG LVL3 (GOWN DISPOSABLE) ×1 IMPLANT
GUIDEWIRE STR DUAL SENSOR (WIRE) ×1 IMPLANT
GUIDEWIRE ZIPWRE .038 STRAIGHT (WIRE) IMPLANT
IV NS 1000ML (IV SOLUTION) ×1
IV NS 1000ML BAXH (IV SOLUTION) ×1 IMPLANT
IV NS IRRIG 3000ML ARTHROMATIC (IV SOLUTION) ×1 IMPLANT
KIT TURNOVER CYSTO (KITS) ×1 IMPLANT
LASER FIB FLEXIVA PULSE ID 365 (Laser) IMPLANT
MANIFOLD NEPTUNE II (INSTRUMENTS) ×1 IMPLANT
NS IRRIG 500ML POUR BTL (IV SOLUTION) ×1 IMPLANT
PACK CYSTO (CUSTOM PROCEDURE TRAY) ×1 IMPLANT
SLEEVE SCD COMPRESS KNEE MED (STOCKING) ×1 IMPLANT
SYR 10ML LL (SYRINGE) ×1 IMPLANT
TRACTIP FLEXIVA PULS ID 200XHI (Laser) IMPLANT
TRACTIP FLEXIVA PULSE ID 200 (Laser)
TUBE CONNECTING 12X1/4 (SUCTIONS) ×1 IMPLANT
TUBING UROLOGY SET (TUBING) ×1 IMPLANT

## 2023-06-04 NOTE — H&P (Signed)
Office Visit Report     05/10/2023   --------------------------------------------------------------------------------   [name]  MRN: [id number]  DOB: Jan 04, 1971, 52 year old Male   PRIMARY CARE:  Fleet Contras, MD  PRIMARY CARE FAX:  [phone]  REFERRING:  Jannifer Hick, MD  PROVIDER:  Jettie Pagan, M.D.  LOCATION:  [facility] [phone]     --------------------------------------------------------------------------------   CC/HPI: James Larsen is a 52 year old male seen in followup with urothelial CIS, recurrent urolithiasis, renal cysts and LUTS.   1. Urothelial CIS:  -Patient presented in 08/2021 with a 65-month history of progressively worsening lower urinary tract symptoms. He stated that approximately 6 months prior, he developed signs and symptoms of urinary tract infection. He had some ciprofloxacin available to him so he took this with improved symptoms. At the time, he did state that he had a negative urinalysis.  -His most bothersome complaints were urinary frequency and urgency. He did state that he had bladder pain/pressure with bladder filling that improves with bladder emptying.  -He was prescribed Jalyn (dutasteride/tamsulosin) by another provider in early 2023 that did not improve his symptoms.   BPH evaluation 09/27/2021:  -TRUS volume 26.7 mL  -Cystoscopy with nonobstructing prostate. There was an approximately 1 cm area of erythema in the posterior left wall that appeared inflammatory, however unable to rule out CIS. There were no papillary lesions identified.  -Uroflow with peak flow rate 8.2 cc/second and average flow rate 4.9 cc/second.  -PVR 0 mL   He is s/p bladder biopsy on 11/21/2021. Pathology resulted urothelial carcinoma in situ.  -CT hematuria protocol 12/01/2021 NED.  -He completed BCG induction 6/6 cycles in 01/2022. He had worsening irritative symptoms with BCG however was able to tolerate all cycles.  -3 mo surveillance  cystoscopy 03/07/2022 with erythema and suspicious areas encompassing about 3 x 3 cm area over his left lateral wall. He had a smaller area about 2 x 1 cm on his dome oon with no areas of suspicious lesions. Suspicious for recurrent CIS.  -S/p TURBT 04/03/2022: NED (submucosa with chronic inflammation, dystrophic calcification, stromal hemmorhage)  -He tolerated 2/3 cycles of his 6-month BCG maintenance in 05/2022.  -Surveillance cysto 06/2022 with erythema, edema over left lateral wall, unable to rule out CIS.  -S/p TURBT 07/28/2022: NED (prominent acute and chronic inflammation, urothelium with atypia, benign reactive changes)  -He declines further BCG given that he poorly tolerates this therapy.  -He requested second opinion with [facility]. He underwent bladder biopsy on 10/07/2022 with Dr. Assunta Found that revealed benign urothelium with reactive changes including acute and chronic inflammation, ulcer and granulation tissue. Further, he underwent bluelight cystoscopy with no areas of suspicious lesions. He has transitioned his care back to [facility].  -Surveillance cysto 05/10/2023 markedly improved with only small amount of inflammation overlying the left lateral aspect bladder at the prior resection site.  -CT hematuria protocol 04/27/2023 with asymmetric wall thickening in the left bladder wall not suspected change since 11/2021. Also present was asymmetric mild smooth urothelial wall thickening involving the distal 3 cm left pelvic ureter.   He denies gross hematuria.   #2. LUTS: He states he is doing much better with decreased inflammation now that he has changes diet and is undergoing pelvic floor physical therapy.   #3. Prostate cancer screening: PSA in 08/18/2021 was 0.61. He denies a family history of prostate cancer.   #4. Urolithiasis:  -CT A/P 04/22/2020 revealed a 1 mm calculus in the distal right ureter near  the UVJ with moderate hydronephrosis. No intrarenal calculus identified. He was able to  pass the stone.  -Recent CT A/P 04/2023 with no urolithiasis.  He underwent metabolic evaluation. 24-hour urine on 06/28/2020 revealed borderline hyperoxaluria, borderline elevated urine pH and elevated uric acid. Counseled him to treat with a low oxalate diet, continue hydration to ensure adequate urine volume and reduce meat protein to treat his hyperuricosuria.   5. Simple bilateral renal cysts:  Most recent CT A/P with several simple bilateral renal cortical cysts largest 3.3 cm in the right lower pole. No suspicious masses.   He is from [place]. 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     ALLERGIES: No Allergies    MEDICATIONS: Percocet 5 mg-325 mg tablet 1 tablet PO Q 4 H PRN  Amitriptyline Hcl 25 mg tablet Oral  Aspirin 81 MG TABS Oral  Losartan-Hydrochlorothiazide 50 mg-12.5 mg tablet Oral     GU PSH: Bladder Instill AntiCA Agent - 06/23/2022, 05/22/2022, 05/15/2022, 01/23/2022, 01/10/2022, 01/03/2022, 12/20/2021, 12/13/2021, 12/06/2021 Complex Uroflow - 2023 Cystoscopy - 11/07/2022, 07/04/2022, 03/07/2022, 2023 Cystoscopy TURBT <2 cm - 04/03/2022 Cystoscopy TURBT 2-5 cm - 07/28/2022 Locm 300-399Mg /Ml Iodine,1Ml - 04/18/2023, 12/01/2021       PSH Notes: No Surgical Problems   NON-GU PSH: Visit Complexity (formerly GPC1X) - 03/27/2023     GU PMH: CIS of the bladder - 04/18/2023, - 03/27/2023, - 11/07/2022, - 08/10/2022, - 07/04/2022, - 06/23/2022, - 06/13/2022, - 05/22/2022, - 05/15/2022, - 04/10/2022, - 03/07/2022, - 02/06/2022, - 01/23/2022, - 01/17/2022, - 01/10/2022, - 01/03/2022, - 12/27/2021, - 12/20/2021, - 12/13/2021, - 12/01/2021, - 11/28/2021 BPH w/LUTS - 03/27/2023, - 11/07/2022, - 12/06/2021, - 2023, - 2023 Urinary Urgency - 03/27/2023, - 06/13/2022, - 03/07/2022, - 11/28/2021, - 2023 Encounter for Prostate Cancer screening - 11/07/2022, - 08/10/2022, - 06/13/2022, - 04/10/2022, - 12/06/2021, - 11/28/2021, - 2023, - 2023 History of urolithiasis - 11/07/2022, - 07/04/2022, - 06/13/2022, - 04/10/2022, - 2023, -  2023, - 2022, - 2021, - 2021, Nephrolithiasis, - 2014 Pelvic/perineal pain - 08/10/2022 Dysuria - 07/04/2022, - 06/13/2022, - 02/06/2022 Suprapubic pain - 02/06/2022, - 2023 Urinary Frequency - 01/17/2022, - 11/28/2021, - 2023, - 2023 Gross hematuria - 12/27/2021 Renal cyst - 11/28/2021, - 2023, - 2023, - 2022, - 2021, Renal cyst, acquired, right, - 2014 Chronic prostatitis - 2023 LLQ pain, Abdominal Pain In The Left Lower Belly (LLQ) - 2014 Other microscopic hematuria, Microscopic Hematuria - 2014 Renal calculus, Nephrolithiasis - 2014 Ureteral calculus, Ureteral Stone - 2014    NON-GU PMH: Personal history of other diseases of the circulatory system, History of hypertension - 2014 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, Hepatitis, C Virus - 2014 Encounter for general adult medical examination without abnormal findings, Encounter for preventive health examination    FAMILY HISTORY: Acute Myocardial Infarction - Mother Death In The Family Father - Runs In Family Death In The Family Mother - Runs In Family Family Health Status Number - Runs In Family   SOCIAL HISTORY: Marital Status: Married     Notes: Never A Smoker   REVIEW OF SYSTEMS:    GU Review Male:   Patient denies frequent urination, hard to postpone urination, burning/ pain with urination, get up at night to urinate, leakage of urine, stream starts and stops, trouble starting your stream, have to strain to urinate , erection problems, and penile pain.  Gastrointestinal (Upper):   Patient denies nausea, vomiting, and indigestion/ heartburn.  Gastrointestinal (Lower):   Patient denies  diarrhea and constipation.  Constitutional:   Patient denies fever, night sweats, weight loss, and fatigue.  Skin:   Patient denies skin rash/ lesion and itching.  Eyes:   Patient denies blurred vision and double vision.  Ears/ Nose/ Throat:   Patient denies sore throat and sinus problems.  Hematologic/Lymphatic:   Patient denies swollen  glands and easy bruising.  Cardiovascular:   Patient denies chest pains and leg swelling.  Respiratory:   Patient denies cough and shortness of breath.  Endocrine:   Patient denies excessive thirst.  Musculoskeletal:   Patient denies back pain and joint pain.  Neurological:   Patient denies headaches and dizziness.  Psychologic:   Patient denies depression and anxiety.   VITAL SIGNS: None   MULTI-SYSTEM PHYSICAL EXAMINATION:    Constitutional: Well-nourished. No physical deformities. Normally developed. Good grooming.  Respiratory: No labored breathing, no use of accessory muscles.   Cardiovascular: Normal temperature, normal extremity pulses, no swelling, no varicosities.  Gastrointestinal: No mass, no tenderness, no rigidity, non obese abdomen.     Complexity of Data:  Source Of History:  Patient, Medical Record Summary  Lab Test Review:   PSA  Records Review:   AUA Symptom Score, Previous Doctor Records, Previous Patient Records  Urine Test Review:   Urinalysis  X-Ray Review: C.T. Hematuria: Reviewed Films. Reviewed Report. Discussed With Patient.     08/18/21  PSA  Total PSA 0.61 ng/mL    PROCEDURES:         Flexible Cystoscopy - 52000  Risks, benefits, and some of the potential complications of the procedure were discussed at length with the patient including infection, bleeding, voiding discomfort, urinary retention, fever, chills, sepsis, and others. All questions were answered. Informed consent was obtained. Antibiotic prophylaxis was given. Sterile technique and intraurethral analgesia were used.  Meatus:  Normal size. Normal location. Normal condition.  Urethra:  No strictures.  External Sphincter:  Normal.  Verumontanum:  Normal.  Prostate:  Non-obstructing. No hyperplasia.  Bladder Neck:  Non-obstructing.  Ureteral Orifices:  Normal location. Normal size. Normal shape. Effluxed clear urine.  Bladder:  No trabeculation. No tumors. Only very minimal amount of residual  inflammation along the left lateral wall prior resection site. There has been biopsied multiple times with no evidence of malignancy.      The lower urinary tract was carefully examined. The procedure was well-tolerated and without complications. Antibiotic instructions were given. Instructions were given to call the office immediately for bloody urine, difficulty urinating, urinary retention, painful or frequent urination, fever, chills, nausea, vomiting or other illness. The patient stated that he understood these instructions and would comply with them.         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  Orders Labs Urine Cytology          Schedule Return Visit/Planned Activity: Next Available Appointment - Schedule Surgery          Document Letter(s):  Created for Patient: Clinical Summary         Notes:    #1. Urothelial CIS:  -Dx 11/2021. S/p 6/6 cycles BCG. Repeat TURBT 02/2022 NED.  -Completed 2/3 cycles of maintenance BCG in 10/23. Unable to tolerate any further.  -TURBT 07/28/2022: NED  -TURBT at [facility] 09/2022: NED  -Reviewed CT urogram as above with smooth urothelial wall thickening involving distal 3 cm left ureter. We discussed that this is most likely benign and is most likely a result of his inflammation however cannot rule out possibility of urothelial  neoplasm. Recommend cystoscopy with left retrograde pyelogram and left ureteroscopy with possible biopsy. He agrees.  -Cystoscopy today with no evidence of recurrent bladder tumor. He has only minimal inflammation present over the prior left resection site. This been biopsied multiple times and has always resulted negative.   #2. LUTS: Continue Myrbetriq.   #3. Prostate cancer screening: PSA 08/2021 was normal at 0.61. DRE 45 g, no nodules. Will plan for annual PSA.   4. Recurrent nephrolithiasis:  -Recent CT A/P 04/2023 with no evidence of urolithiasis.   5. Bilateral renal cysts: We discussed that incidence of renal since is 80% in general population by the time reaching age 57. No further surveillance is required.   CC: Fleet Contras, MD    Urology Preoperative H&P   Chief Complaint: Left ureteral lesion  History of Present Illness: James Larsen is a 52 y.o. male with a left ureteral lesion here for cysto, L RPG, L URS, possible biopsy, possible stent. Denies fevers, chills, dysuria.    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   urologist--- dr Blanche Scovell;    s/p TURBT 04/ 2023;  recurrent 12/ 2023   Dysuria    Hepatitis B    remote infection yrs ago   History of DVT of lower extremity 11/29/2014   RLE w/ occluded peroneal vein,  completed eliquis   History of hepatitis B    remote history   History of kidney stones    History of malaria 2016   malaria [place]   History of pulmonary embolism 11/29/2014   RLL segmental branch,  completed eliquis  (at same time DVT);    (11-17-2021  per pt had negative blood work up for disorders,  stated never had clots prior to this episode and none since)   Hyperlipidemia    Hypertension    followed by pcp   (nuclear stress test in epic 04-25-2014 low risk no ischemia, ef 59%)   Nocturia more than twice per night    Urgency of urination     Vitreomacular traction syndrome of right eye    per pt no vision loss   Wears glasses     Past Surgical History:  Procedure Laterality Date   CYSTOSCOPY N/A 07/28/2022   Procedure: CYSTOSCOPY, BLADDER BIOIPSY;  Surgeon: Jannifer Hick, MD;  Location[facility];  Service: Urology;  Laterality: N/A;  ANESTHESIA IS GENERAL PARALYZED   CYSTOSCOPY WITH BIOPSY N/A 11/21/2021   Procedure: CYSTOSCOPY WITH BIOPSY/ FULGURATION/ LOW PRESSURE HYDRODISTENSION;  Surgeon: Jannifer Hick, MD;  Location: [facility];  Service: Urology;  Laterality: N/A;  ONLY NEEDS 30 MIN   CYSTOSCOPY WITH BIOPSY N/A 04/03/2022   Procedure: CYSTOSCOPY WITH BLADDER BIOPSY/ TRANSURETHRAL RESECTION OF BLADDER TUMOR;  Surgeon: Jannifer Hick, MD;  Location: [facility];  Service: Urology;  Laterality: N/A;  GENERAL ANESTHESIA WITH PARALYSIS/INTUBATED   surgery for bladder tumors  09/19/2022   at [facility] RESECTION OF BLADDER TUMOR N/A 07/28/2022   Procedure: TRANSURETHRAL RESECTION OF BLADDER TUMOR (TURBT);  Surgeon: Jannifer Hick, MD;  Location: [facility];  Service: Urology;  Laterality: N/A;    Allergies: No Known Allergies  Family History  Problem Relation Age of Onset   Hypertension Mother     Social History:  reports that he has never smoked. He has never used smokeless tobacco. He reports that he does not currently use alcohol. He reports that he does not use drugs.  ROS: A complete review of systems was performed.  All systems are negative except for pertinent findings as noted.  Physical Exam:  Vital signs in last 24 hours: Temp:  [97.4 F (36.3 C)] 97.4 F (36.3 C) (10/21 1129) Pulse Rate:  [73] 73 (10/21 1129) Resp:  [16] 16 (10/21 1129) BP: (137)/(88) 137/88 (10/21 1147) SpO2:  [96 %] 96 % (10/21 1129) Weight:  [101 kg] 101 kg (10/21 1129) Constitutional:  Alert and oriented, No acute distress Cardiovascular: Regular rate and  rhythm Respiratory: Normal respiratory effort, Lungs clear bilaterally GI: Abdomen is soft, nontender, nondistended, no abdominal masses GU: No CVA tenderness Lymphatic: No lymphadenopathy Neurologic: Grossly intact, no focal deficits Psychiatric: Normal mood and affect  Laboratory Data:  Recent Labs    06/04/23 1147  HGB 16.0  HCT 47.0    Recent Labs    06/04/23 1147  NA 142  K 3.7  CL 105  GLUCOSE 87  BUN 13  CREATININE 1.20     Results for orders placed or performed during the hospital encounter of 06/04/23 (from the past 24 hour(s))  I-STAT, chem 8     Status: None   Collection Time: 06/04/23 11:47 AM  Result Value Ref Range   Sodium 142 135 - 145 mmol/L   Potassium 3.7 3.5 - 5.1 mmol/L   Chloride 105 98 - 111 mmol/L   BUN 13 6 - 20 mg/dL   Creatinine, Ser 4.09 0.61 - 1.24 mg/dL   Glucose, Bld 87 70 - 99 mg/dL   Calcium, Ion 8.11 9.14 - 1.40 mmol/L   TCO2 24 22 - 32 mmol/L   Hemoglobin 16.0 13.0 - 17.0 g/dL   HCT 78.2 95.6 - 21.3 %   No results found for this or any previous visit (from the past 240 hour(s)).  Renal Function: Recent Labs    06/04/23 1147  CREATININE 1.20   Estimated Creatinine Clearance: 85.2 mL/min (by C-G formula based on SCr of 1.2 mg/dL).  Radiologic Imaging: No results found.  I independently reviewed the above imaging studies.  Assessment and Plan Aqeel Relyea is a 52 y.o. male with a left ureteral lesion here for cysto, L RPG, L URS, possible biopsy, possible stent.   -The risks, benefits and alternatives of cystoscopy with left ureteroscopy, possible biopsy, possible left JJ stent placement was discussed with the patient.  Risks include, but are not limited to: bleeding, urinary tract infection, ureteral injury, ureteral stricture disease, chronic pain, urinary symptoms, bladder injury, stent migration, the need for nephrostomy tube placement, MI, CVA, DVT, PE and the inherent risks with general  anesthesia.  The patient  voices understanding and wishes to proceed.   Matt R. Stoney Karczewski MD 06/04/2023, 12:36 PM  [facility] Pager: [phone]

## 2023-06-04 NOTE — Discharge Instructions (Signed)
[facility] °[phone] °Post Ureteroscopy With or Without Stent Instructions ° °Definitions: ° °Ureter: The duct that transports urine from the kidney to the bladder. °Stent:   A plastic hollow tube that is placed into the ureter, from the kidney to the bladder to prevent the ureter from swelling shut. ° °GENERAL INSTRUCTIONS: ° °Despite the fact that no skin incisions were used, the area around the ureter and bladder is raw and irritated. The stent is a foreign body which will further irritate the bladder wall. This irritation is manifested by increased frequency of urination, both day and night, and by an increase in the urge to urinate. In some, the urge to urinate is present almost always. Sometimes the urge is strong enough that you may not be able to stop yourself from urinating. The only real cure is to remove the stent and then give time for the bladder wall to heal which can't be done until the danger of the ureter swelling shut has passed, which varies. ° °You may see some blood in your urine while the stent is in place and a few days afterwards. Do not be alarmed, even if the urine was clear for a while. Get off your feet and drink lots of fluids until clearing occurs. If you start to pass clots or don't improve, call us. ° °DIET: °You may return to your normal diet immediately. Because of the raw surface of your bladder, alcohol, spicy foods, acid type foods and drinks with caffeine may cause irritation or frequency and should be used in moderation. To keep your urine flowing freely and to avoid constipation, drink plenty of fluids during the day ( 8-10 glasses ). °Tip: Avoid cranberry juice because it is very acidic. ° °ACTIVITY: °Your physical activity doesn't need to be restricted. However, if you are very active, you may see some blood in your urine. We suggest that you reduce your activity under these circumstances until the bleeding has stopped. ° °BOWELS: °It is important to  keep your bowels regular during the postoperative period. Straining with bowel movements can cause bleeding. A bowel movement every other day is reasonable. Use a mild laxative if needed, such as Milk of Magnesia 2-3 tablespoons, or 2 Dulcolax tablets. Call if you continue to have problems. If you have been taking narcotics for pain, before, during or after your surgery, you may be constipated. Take a laxative if necessary. ° ° °MEDICATION: °You should resume your pre-surgery medications unless told not to. In addition you will often be given an antibiotic to prevent infection. These should be taken as prescribed until the bottles are finished unless you are having an unusual reaction to one of the drugs. ° °PROBLEMS YOU SHOULD REPORT TO US: °Fevers over 100.5 Fahrenheit. °Heavy bleeding, or clots ( See above notes about blood in urine ). °Inability to urinate. °Drug reactions ( hives, rash, nausea, vomiting, diarrhea ). °Severe burning or pain with urination that is not improving. ° °FOLLOW-UP: °You will need a follow-up appointment to monitor your progress. Call for this appointment at the number listed above. Usually the first appointment will be about three to fourteen days after your surgery. ° ° ° °  ° ° ° °Post Anesthesia Home Care Instructions ° °Activity: °Get plenty of rest for the remainder of the day. A responsible individual must stay with you for 24 hours following the procedure.  °For the next 24 hours, DO NOT: °-Drive a car °-Operate machinery °-Drink alcoholic beverages °-Take any medication   unless instructed by your physician °-Make any legal decisions or sign important papers. ° °Meals: °Start with liquid foods such as gelatin or soup. Progress to regular foods as tolerated. Avoid greasy, spicy, heavy foods. If nausea and/or vomiting occur, drink only clear liquids until the nausea and/or vomiting subsides. Call your physician if vomiting continues. ° °Special Instructions/Symptoms: °Your throat  may feel dry or sore from the anesthesia or the breathing tube placed in your throat during surgery. If this causes discomfort, gargle with warm salt water. The discomfort should disappear within 24 hours. ° °

## 2023-06-04 NOTE — Op Note (Signed)
Operative Note  Preoperative diagnosis:  1.  Left ureteral lesion  Postoperative diagnosis: 1.  Left ureteral stricture  Procedure(s): 1.  Cystoscopy 2. Left ureteroscopy with brush biopsy 3. Left retrograde pyelogram 4. Fluoroscopy with intraoperative interpretation  Surgeon: Jettie Pagan, MD  Assistants:  None  Anesthesia:  General  Complications:  None  EBL:  Minimal  Specimens: ID Type Source Tests Collected by Time Destination  A : Left ureteral stricture brush biopsy Body Fluid PATH Cytology Misc. fluid CYTOLOGY - NON PAP Jannifer Hick, MD 06/04/2023 1326    Drains/Catheters: 1.  None  Intraoperative findings:   Cystoscopy demonstrated stable appearance of left lateral wall inflammation that has been biopsied twice in the last 12 months without evidence of malignancy. Left retrograde pyelogram with intrinsic narrowing of distal left ureter for approximately 5cm and again in the left proximal ureter however each of these were easily navigated with the semirigid ureteroscope. No lesion within the ureter. Brush biopsy was taken of the distal left ureteral stricture.  Indication:  James Larsen is a 52 y.o. 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 He is being brought to the OR for possible biopsy.  Description of procedure: After informed consent was obtained from the patient, the patient was identified and taken to the operating room and placed in the supine position.  General anesthesia was administered as well as perioperative IV antibiotics.  At the beginning of the case, a time-out was performed to properly identify the patient, the surgery to be performed, and the surgical site.  Sequential compression devices were applied to the lower extremities at the beginning of the case for DVT prophylaxis.  The patient was then placed in the dorsal lithotomy supine  position, prepped and draped in sterile fashion.  We then passed the 21-French rigid cystoscope through the urethra and into the bladder under vision without any difficulty, noting a normal urethra without strictures and a mildly obstructing prostate.  A systematic evaluation of the bladder revealed no evidence of any suspicious bladder lesions. He has stable left lateral wall inflammation. Ureteral orifices were in normal position and the left one did appear slightly stenotic.  Under cystoscopic and flouroscopic guidance, we cannulated the left ureteral orifice with a 5-French open-ended ureteral catheter and a gentle retrograde pyelogram was performed, revealing an intrinsic narrowing the distal 5cm of the ureter and again the proximal 3cm ureter. There was no hydronephrosis of the collecting system. A 0.038 sensor wire was then passed up to the level of the renal pelvis and secured to the drape as a safety wire. The ureteral catheter and cystoscope were removed, leaving the safety wire in place.   A semi-rigid ureteroscope was passed alongside the wire up the distal ureter which had intrinsic narrowing however no mass. This was easily bypassed to the proximal ureter where it again appeared stenotic however easily bypassed. A brush biopsy was used to obtain pathology for cytology of the distal left ureter.   The bladder was then emptied with irrigation solution.  The cystoscope was then removed.    The patient tolerated the procedure well and there was no complication. Patient was awoken from anesthesia and taken to the recovery room in stable condition. I was present and scrubbed for the entirety of the case.  Matt R. Ioan Landini MD [facility]  Pager: [phone]

## 2023-06-04 NOTE — Transfer of Care (Signed)
Immediate Anesthesia Transfer of Care Note  Patient: James Larsen  Procedure(s) Performed: CYSTOSCOPY/LEFT RETROGRADE PYELOGRAM/LEFT URETEROSCOPY/BIOPSY (Left: Renal)  Patient Location: PACU  Anesthesia Type:General  Level of Consciousness: awake, alert , oriented, and patient cooperative  Airway & Oxygen Therapy: Patient Spontanous Breathing  Post-op Assessment: Report given to RN and Post -op Vital signs reviewed and stable  Post vital signs: Reviewed and stable  Last Vitals:  Vitals Value Taken Time  BP 138/84 06/04/23 1345  Temp    Pulse 78 06/04/23 1345  Resp 20 06/04/23 1345  SpO2 97 % 06/04/23 1345  Vitals shown include unfiled device data.  Last Pain:  Vitals:   06/04/23 1129  TempSrc: Oral  PainSc: 0-No pain      Patients Stated Pain Goal: 4 (06/04/23 1129)  Complications: No notable events documented.

## 2023-06-04 NOTE — ED Provider Triage Note (Signed)
Emergency Medicine Provider Triage Evaluation Note  James Larsen , a 52 y.o. male  was evaluated in triage.  Pt complains of left flank pain.  Review of Systems  Positive:  Negative:   Physical Exam  There were no vitals taken for this visit. Gen:   Awake, no distress   Resp:  Normal effort  MSK:   Moves extremities without difficulty  Other:    Medical Decision Making  Medically screening exam initiated at 5:54 PM.  Appropriate orders placed.  James Larsen was informed that the remainder of the evaluation will be completed by another provider, this initial triage assessment does not replace that evaluation, and the importance of remaining in the ED until their evaluation is complete.  Had surgery today here at [facility] for "left ureteral lesion here for cysto, L RPG, L URS, possible biopsy, possible stent" per surgery note. Now here concerned for left flank pain. Oxy has not helped. Pain is getting worse. Denies fever, nausea, vomiting, diarrhea.    James Larsen, [place] 06/04/23 [id number]

## 2023-06-04 NOTE — ED Triage Notes (Signed)
Pt arrived reporting he had a Ureter procedure today that was scheduled with possible stent placement, stent was not placed. Endorses leaving outpatient surgery center around 1pm and pain has not subsided. States he took his prescribed oxycodone twice. Left flank pain persist. No fever, chills, shob or any other symptoms.

## 2023-06-04 NOTE — Anesthesia Postprocedure Evaluation (Signed)
Anesthesia Post Note  Patient: James Larsen  Procedure(s) Performed: CYSTOSCOPY/LEFT RETROGRADE PYELOGRAM/LEFT URETEROSCOPY/BIOPSY (Left: Renal)     Patient location during evaluation: PACU Anesthesia Type: General Level of consciousness: awake Pain management: pain level controlled Vital Signs Assessment: post-procedure vital signs reviewed and stable Respiratory status: spontaneous breathing Cardiovascular status: stable Postop Assessment: no apparent nausea or vomiting Anesthetic complications: no  No notable events documented.  Last Vitals:  Vitals:   06/04/23 1400 06/04/23 1410  BP: (!) 135/94 128/87  Pulse: 97 81  Resp: 13 (!) 21  Temp:  36.4 C  SpO2: 95% 99%    Last Pain:  Vitals:   06/04/23 1410  TempSrc:   PainSc: 0-No pain                 Caren Macadam

## 2023-06-04 NOTE — Anesthesia Procedure Notes (Signed)
Procedure Name: LMA Insertion Date/Time: 06/04/2023 1:09 PM  Performed by: Bishop Limbo, CRNAPre-anesthesia Checklist: Patient identified, Emergency Drugs available, Suction available and Patient being monitored Patient Re-evaluated:Patient Re-evaluated prior to induction Oxygen Delivery Method: Circle System Utilized Preoxygenation: Pre-oxygenation with 100% oxygen Induction Type: IV induction Ventilation: Mask ventilation without difficulty LMA: LMA inserted LMA Size: 5.0 Number of attempts: 1 Placement Confirmation: positive ETCO2 Tube secured with: Tape Dental Injury: Teeth and Oropharynx as per pre-operative assessment

## 2023-06-04 NOTE — Anesthesia Preprocedure Evaluation (Signed)
Anesthesia Evaluation  Patient identified by MRN, date of birth, ID band Patient awake    Reviewed: Allergy & Precautions, NPO status , Patient's Chart, lab work & pertinent test results  Airway Mallampati: III       Dental no notable dental hx. (+) Dental Advisory Given   Pulmonary PE: 2016.   Pulmonary exam normal breath sounds clear to auscultation       Cardiovascular hypertension, Pt. on medications Normal cardiovascular exam     Neuro/Psych negative neurological ROS  negative psych ROS   GI/Hepatic negative GI ROS,,,  Endo/Other  negative endocrine ROS    Renal/GU negative Renal ROS Bladder dysfunction: bladder ca.      Musculoskeletal negative musculoskeletal ROS (+)    Abdominal  (+) + obese  Peds negative pediatric ROS (+)  Hematology negative hematology ROS (+)   Anesthesia Other Findings   Reproductive/Obstetrics (+) Pregnancy                             Anesthesia Physical Anesthesia Plan  ASA: 2  Anesthesia Plan: General   Post-op Pain Management:    Induction: Intravenous  PONV Risk Score and Plan: 3 and Ondansetron, Dexamethasone, Midazolam and Treatment may vary due to age or medical condition  Airway Management Planned: LMA  Additional Equipment: None  Intra-op Plan:   Post-operative Plan: Extubation in OR  Informed Consent: I have reviewed the patients History and Physical, chart, labs and discussed the procedure including the risks, benefits and alternatives for the proposed anesthesia with the patient or authorized representative who has indicated his/her understanding and acceptance.     Dental advisory given  Plan Discussed with: CRNA  Anesthesia Plan Comments: ( )        Anesthesia Quick Evaluation

## 2023-06-05 LAB — CYTOLOGY - NON PAP

## 2023-06-06 ENCOUNTER — Encounter (HOSPITAL_BASED_OUTPATIENT_CLINIC_OR_DEPARTMENT_OTHER): Payer: Self-pay | Admitting: Urology

## 2023-06-11 DIAGNOSIS — G893 Neoplasm related pain (acute) (chronic): Secondary | ICD-10-CM | POA: Diagnosis not present

## 2023-06-11 DIAGNOSIS — I10 Essential (primary) hypertension: Secondary | ICD-10-CM | POA: Diagnosis not present

## 2023-06-11 DIAGNOSIS — N200 Calculus of kidney: Secondary | ICD-10-CM | POA: Diagnosis not present

## 2023-06-11 DIAGNOSIS — G8929 Other chronic pain: Secondary | ICD-10-CM | POA: Diagnosis not present

## 2023-06-11 DIAGNOSIS — D090 Carcinoma in situ of bladder: Secondary | ICD-10-CM | POA: Diagnosis not present

## 2023-06-11 DIAGNOSIS — G8918 Other acute postprocedural pain: Secondary | ICD-10-CM | POA: Diagnosis not present

## 2024-01-24 ENCOUNTER — Other Ambulatory Visit: Payer: Self-pay | Admitting: Urology

## 2024-01-24 DIAGNOSIS — D090 Carcinoma in situ of bladder: Secondary | ICD-10-CM

## 2024-01-25 ENCOUNTER — Encounter: Payer: Self-pay | Admitting: Urology

## 2024-01-31 ENCOUNTER — Ambulatory Visit
Admission: RE | Admit: 2024-01-31 | Discharge: 2024-01-31 | Disposition: A | Discharge status: Home / Self Care | Source: Ambulatory Visit | Attending: Urology | Admitting: Urology

## 2024-01-31 DIAGNOSIS — D090 Carcinoma in situ of bladder: Secondary | ICD-10-CM

## 2024-01-31 MED ORDER — IOPAMIDOL (ISOVUE-300) INJECTION 61%
125.0000 mL | Freq: Once | INTRAVENOUS | Status: AC | PRN
  Administered 2024-01-31: 125 mL via INTRAVENOUS
